# Patient Record
Sex: Female | Born: 1995 | Race: Black or African American | Hispanic: No | Marital: Single | Smoking: Former smoker
Health system: Southern US, Community
[De-identification: ages and names within clinical notes are randomized; demographics above are authoritative.]

## PROBLEM LIST (undated history)

## (undated) DIAGNOSIS — J45909 Unspecified asthma, uncomplicated: Secondary | ICD-10-CM

## (undated) DIAGNOSIS — D649 Anemia, unspecified: Secondary | ICD-10-CM

## (undated) DIAGNOSIS — K219 Gastro-esophageal reflux disease without esophagitis: Secondary | ICD-10-CM

---
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
## 2017-04-21 ENCOUNTER — Emergency Department (HOSPITAL_COMMUNITY)
Admission: EM | Admit: 2017-04-21 | Discharge: 2017-04-21 | Disposition: A | Discharge status: Home / Self Care | Payer: Medicaid Other | Attending: Emergency Medicine | Admitting: Emergency Medicine

## 2017-04-21 DIAGNOSIS — R0602 Shortness of breath: Secondary | ICD-10-CM

## 2017-04-21 DIAGNOSIS — R062 Wheezing: Secondary | ICD-10-CM

## 2017-04-21 DIAGNOSIS — J4541 Moderate persistent asthma with (acute) exacerbation: Secondary | ICD-10-CM | POA: Insufficient documentation

## 2017-04-21 DIAGNOSIS — Z79899 Other long term (current) drug therapy: Secondary | ICD-10-CM | POA: Insufficient documentation

## 2017-04-21 MED ORDER — IPRATROPIUM BROMIDE 0.02 % IN SOLN: 0.5000 mg | Freq: Four times a day (QID) | RESPIRATORY_TRACT | 0 refills | Status: DC | PRN

## 2017-04-21 MED ORDER — PREDNISONE 20 MG PO TABS
60.0000 mg | ORAL_TABLET | Freq: Once | ORAL | Status: AC
  Administered 2017-04-21: 60 mg via ORAL
  Filled 2017-04-21: qty 3

## 2017-04-21 MED ORDER — IPRATROPIUM BROMIDE 0.02 % IN SOLN
0.5000 mg | Freq: Once | RESPIRATORY_TRACT | Status: AC
  Administered 2017-04-21: 0.5 mg via RESPIRATORY_TRACT
  Filled 2017-04-21: qty 2.5

## 2017-04-21 MED ORDER — ALBUTEROL SULFATE (2.5 MG/3ML) 0.083% IN NEBU
5.0000 mg | INHALATION_SOLUTION | Freq: Once | RESPIRATORY_TRACT | Status: AC
  Administered 2017-04-21: 5 mg via RESPIRATORY_TRACT
  Filled 2017-04-21: qty 6

## 2017-04-21 MED ORDER — PREDNISONE 20 MG PO TABS: ORAL_TABLET | ORAL | 0 refills | Status: DC

## 2017-04-21 NOTE — Discharge Instructions (Signed)
Continue to stay well-hydrated. Use Mucinex for cough suppression/expectoration of mucus. Use over the counter antihistamines such as zyrtec or allegra to decrease symptoms and frequency of asthma attacks. Use albuterol nebulizer at home, and use the added atrovent nebulizer medication, both as directed as needed for cough/chest congestion/wheezing/shortness of breath/etc. Take prednisone as directed for your asthma exacerbation, starting tomorrow since you received today's dose in the ER today. Followup with your primary care doctor in 5-7 days for recheck of ongoing symptoms. Return to emergency department for emergent changing or worsening of symptoms.

## 2017-04-21 NOTE — ED Notes (Signed)
Pt ambulatory and independent at discharge.  Verbalized understanding of discharge instructions 

## 2017-04-21 NOTE — ED Triage Notes (Signed)
Pt states that she started having asthma attack this morning and used her neb at home w/o relief. 97% RA. Coughing. Alert and oriented.

## 2017-04-21 NOTE — ED Provider Notes (Signed)
[facility]-EMERGENCY DEPT Provider Note   CSN: [id number] Arrival date & time: 04/21/17  1408     History   Chief Complaint Chief Complaint  Patient presents with  . Asthma    HPI Ashley Benton is a 21 y.o. female with a PMHx of asthma, who presents to the ED with complaints of asthma attack that began around 10:30 AM approximately 4 hours prior to evaluation. Patient states that she was at work and she was sweeping outside, and felt that coming back into the air conditioned building after being outside in the heat and mugginess/weather triggered her to have an asthma attack. Her symptoms feel the same as her prior asthma attacks, and include: Chest tightness, wheezing, shortness of breath, dry cough, and rhinorrhea. She used her albuterol nebulizer with minimal relief. Outside exposure worsened symptoms. She has frequent asthma attacks, her most recent one was 02/19/17 when [facility]; that was the last time she was on steroids, states she almost always gets put on steroids when her asthma flares up. She is a nonsmoker, denies any sick contacts. She denies any fevers, chills, sore throat, ear pain or drainage, leg swelling, recent travel/surgery/immobilization, abd pain, N/V/D/C, hematuria, dysuria, myalgias, arthralgias, numbness, tingling, focal weakness, or any other complaints at this time.    The history is provided by the patient and medical records. No language interpreter was used.  Shortness of Breath  This is a recurrent problem. The average episode lasts 4 hours. The problem occurs continuously.The current episode started 3 to 5 hours ago. The problem has not changed since onset.Associated symptoms include rhinorrhea, cough and wheezing. Pertinent negatives include no fever, no sore throat, no ear pain, no chest pain, no vomiting, no abdominal pain and no leg swelling. The problem's precipitants include weather/humidity. She has tried beta-agonist inhalers for the  symptoms. The treatment provided mild relief. Associated medical issues include asthma.    Past Medical History:  Diagnosis Date  . Asthma     There are no active problems to display for this patient.   No past surgical history on file.  OB History    No data available       Home Medications    Prior to Admission medications   Medication Sig Start Date End Date Taking? Authorizing Provider  ALBUTEROL IN Inhale into the lungs.    [provider]  amoxicillin (AMOXIL) 500 MG capsule Take 1 capsule (500 mg total) by mouth 3 (three) times daily. 01/23/13   Linna HoffKindl, James D, MD  sulfamethoxazole-trimethoprim (SEPTRA DS) 800-160 MG per tablet Take 1 tablet by mouth 2 (two) times daily. 12/23/13   Susy FrizzleSheldon, Charles, MD    Family History No family history on file.  Social History Social History  Substance Use Topics  . Smoking status: Current Every Day Smoker  . Smokeless tobacco: Not on file  . Alcohol use No     Allergies   Patient has no known allergies.   Review of Systems Review of Systems  Constitutional: Negative for chills and fever.  HENT: Positive for rhinorrhea. Negative for ear discharge, ear pain and sore throat.   Respiratory: Positive for cough, chest tightness, shortness of breath and wheezing.   Cardiovascular: Negative for chest pain and leg swelling.  Gastrointestinal: Negative for abdominal pain, constipation, diarrhea, nausea and vomiting.  Genitourinary: Negative for dysuria and hematuria.  Musculoskeletal: Negative for arthralgias and myalgias.  Skin: Negative for color change.  Allergic/Immunologic: Negative for immunocompromised state.  Neurological: Negative for weakness and numbness.  Psychiatric/Behavioral: Negative for confusion.   All other systems reviewed and are negative for acute change except as noted in the HPI.    Physical Exam Updated Vital Signs BP 108/78 (BP Location: Right Arm)   Pulse 87   Temp 98.3 F (36.8 C)  (Oral)   Resp 20   LMP 04/02/2017 (Exact Date)   SpO2 97%   Physical Exam  Constitutional: She is oriented to person, place, and time. Vital signs are normal. She appears well-developed and well-nourished.  Non-toxic appearance. No distress.  Afebrile, nontoxic, NAD  HENT:  Head: Normocephalic and atraumatic.  Mouth/Throat: Oropharynx is clear and moist and mucous membranes are normal.  Eyes: Conjunctivae and EOM are normal. Right eye exhibits no discharge. Left eye exhibits no discharge.  Neck: Normal range of motion. Neck supple.  Cardiovascular: Normal rate, regular rhythm, normal heart sounds and intact distal pulses.  Exam reveals no gallop and no friction rub.   No murmur heard. Pulmonary/Chest: Effort normal. No respiratory distress. She has decreased breath sounds. She has wheezes. She has no rhonchi. She has no rales.  Airways sound tight, with slightly globally diminished lung sounds throughout, faint expiratory wheezing barely audible, no rhonchi/rales, no hypoxia or increased WOB, speaking in full sentences, SpO2 97% on RA   Abdominal: Soft. Normal appearance and bowel sounds are normal. She exhibits no distension. There is no tenderness. There is no rigidity, no rebound, no guarding, no CVA tenderness, no tenderness at McBurney's point and negative Murphy's sign.  Musculoskeletal: Normal range of motion.  MAE x4 Strength and sensation grossly intact in all extremities Distal pulses intact No pedal edema, neg homan's bilaterally   Neurological: She is alert and oriented to person, place, and time. She has normal strength. No sensory deficit.  Skin: Skin is warm, dry and intact. No rash noted.  Psychiatric: She has a normal mood and affect.  Nursing note and vitals reviewed.    ED Treatments / Results  Labs (all labs ordered are listed, but only abnormal results are displayed) Labs Reviewed - No data to display  EKG  EKG Interpretation None       Radiology No  results found.  Procedures Procedures (including critical care time)  Medications Ordered in ED Medications  albuterol (PROVENTIL) (2.5 MG/3ML) 0.083% nebulizer solution 5 mg (5 mg Nebulization Given 04/21/17 1433)  albuterol (PROVENTIL) (2.5 MG/3ML) 0.083% nebulizer solution 5 mg (5 mg Nebulization Given 04/21/17 1502)  ipratropium (ATROVENT) nebulizer solution 0.5 mg (0.5 mg Nebulization Given 04/21/17 1502)  predniSONE (DELTASONE) tablet 60 mg (60 mg Oral Given 04/21/17 1502)     Initial Impression / Assessment and Plan / ED Course  I have reviewed the triage vital signs and the nursing notes.  Pertinent labs & imaging results that were available during my care of the patient were reviewed by me and considered in my medical decision making (see chart for details).     21 y.o. female here with asthma attack that occurred when she went outside to sweep at work around 10:30am. Used home albuterol neb without much improvement. On exam, tight sounding airways with mildly diminished breath sounds throughout, faint expiratory wheezing, no rhonchi/rales, no hypoxia or tachycardia, no LE swelling. 1st albuterol neb given here is almost done; will get second one with added atrovent as well, and give prednisone. Will reassess after that; doubt need for labs/imaging at this point, but will see how she does after next neb.  4:52 PM Pt feeling much better and lung sounds greatly improved after duoneb. Doubt need for further emergent work up or treatment at this time. Will d/c home with atrovent nebs to use with her albuterol at home, and prednisone taper. Advised OTC remedies to help with asthma control, and use of home asthma meds as well. F/up with PCP in 5-7 days for recheck of symptoms. I explained the diagnosis and have given explicit precautions to return to the ER including for any other new or worsening symptoms. The patient understands and accepts the medical plan as it's been dictated and I have  answered their questions. Discharge instructions concerning home care and prescriptions have been given. The patient is STABLE and is discharged to home in good condition.    Final Clinical Impressions(s) / ED Diagnoses   Final diagnoses:  Moderate persistent asthma with exacerbation  Shortness of breath  Wheezing    New Prescriptions New Prescriptions   IPRATROPIUM (ATROVENT) 0.02 % NEBULIZER SOLUTION    Take 2.5 mLs (0.5 mg total) by nebulization every 6 (six) hours as needed for wheezing or shortness of breath. MAX 4 DOSES PER DAY   PREDNISONE (DELTASONE) 20 MG TABLET    3 tabs po daily x 4 days STARTING 04/22/17     Kalkidan Caudell, [place], [place] 04/21/17 1653    Derwood Kaplan, MD 04/22/17 [phone]

## 2018-08-08 ENCOUNTER — Encounter (HOSPITAL_COMMUNITY): Payer: Self-pay | Admitting: Emergency Medicine

## 2018-08-08 ENCOUNTER — Emergency Department (HOSPITAL_COMMUNITY): Payer: Self-pay

## 2018-08-08 ENCOUNTER — Emergency Department (HOSPITAL_COMMUNITY)
Admission: EM | Admit: 2018-08-08 | Discharge: 2018-08-08 | Disposition: A | Discharge status: Home / Self Care | Payer: Self-pay | Attending: Emergency Medicine | Admitting: Emergency Medicine

## 2018-08-08 DIAGNOSIS — Z87891 Personal history of nicotine dependence: Secondary | ICD-10-CM | POA: Insufficient documentation

## 2018-08-08 DIAGNOSIS — J45901 Unspecified asthma with (acute) exacerbation: Secondary | ICD-10-CM | POA: Insufficient documentation

## 2018-08-08 HISTORY — DX: Gastro-esophageal reflux disease without esophagitis: K21.9

## 2018-08-08 HISTORY — DX: Anemia, unspecified: D64.9

## 2018-08-08 MED ORDER — TINIDAZOLE 500 MG PO TABS: 1000.0000 mg | ORAL_TABLET | Freq: Every day | ORAL | 0 refills | Status: AC

## 2018-08-08 MED ORDER — FLUCONAZOLE 150 MG PO TABS: 150.0000 mg | ORAL_TABLET | Freq: Every day | ORAL | 0 refills | Status: AC

## 2018-08-08 MED ORDER — PREDNISONE 20 MG PO TABS
40.0000 mg | ORAL_TABLET | Freq: Once | ORAL | Status: AC
  Administered 2018-08-08: 40 mg via ORAL
  Filled 2018-08-08: qty 2

## 2018-08-08 MED ORDER — IPRATROPIUM-ALBUTEROL 0.5-2.5 (3) MG/3ML IN SOLN
3.0000 mL | Freq: Once | RESPIRATORY_TRACT | Status: AC
  Administered 2018-08-08: 3 mL via RESPIRATORY_TRACT
  Filled 2018-08-08: qty 3

## 2018-08-08 MED ORDER — ALBUTEROL SULFATE (2.5 MG/3ML) 0.083% IN NEBU: 5.0000 mg | INHALATION_SOLUTION | Freq: Once | RESPIRATORY_TRACT | Status: DC

## 2018-08-08 MED ORDER — PREDNISONE 10 MG PO TABS: 40.0000 mg | ORAL_TABLET | Freq: Every day | ORAL | 0 refills | Status: AC

## 2018-08-08 NOTE — Discharge Instructions (Signed)
Please return for any problem.  Follow-up with your regular care provider as instructed. °

## 2018-08-08 NOTE — ED Triage Notes (Signed)
Pt reports since waking up yesterday felt her asthma causing her to have trouble breathing. Used inhaler about 10 times yesterday and then used nebulizer last night.  Reports that [facility] where about week ago had to get steroids.

## 2018-08-08 NOTE — ED Provider Notes (Signed)
[facility]-EMERGENCY DEPT Provider Note   CSN: [id number] Arrival date & time: 08/08/18  4098     History   Chief Complaint Chief Complaint  Patient presents with  . Asthma    HPI Ashley Benton is a 22 y.o. female.  22 year old female with prior history as detailed below presents for evaluation of asthma exacerbation. Patient reports increased wheezing and cough over the last 24 hours. No associated fever. She has used her MDI at home multiple times without improvement. She is not currently on antibiotics or steroids. She denies associated chest pain, nausea, vomiting, back pain, abdominal pain, or other acute complaint.   She denies pregnancy and declines Pregnancy test today.   The history is provided by the patient and medical records.  Asthma  This is a recurrent problem. The current episode started yesterday. The problem occurs every several days. The problem has not changed since onset.Associated symptoms include shortness of breath. Pertinent negatives include no chest pain, no abdominal pain and no headaches. Nothing aggravates the symptoms. Nothing relieves the symptoms. The treatment provided mild relief.    Past Medical History:  Diagnosis Date  . Acid reflux   . Anemia   . Asthma     There are no active problems to display for this patient.   History reviewed. No pertinent surgical history.   OB History   None      Home Medications    Prior to Admission medications   Medication Sig Start Date End Date Taking? Authorizing Provider  ALBUTEROL IN Inhale into the lungs.    [provider]  amoxicillin (AMOXIL) 500 MG capsule Take 1 capsule (500 mg total) by mouth 3 (three) times daily. 01/23/13   Linna Hoff, MD  ipratropium (ATROVENT) 0.02 % nebulizer solution Take 2.5 mLs (0.5 mg total) by nebulization every 6 (six) hours as needed for wheezing or shortness of breath. MAX 4 DOSES PER DAY 04/21/17   Street, [place], [place]    predniSONE (DELTASONE) 20 MG tablet 3 tabs po daily x 4 days STARTING 04/22/17 04/21/17   Street, [place], PA-C  sulfamethoxazole-trimethoprim (SEPTRA DS) 800-160 MG per tablet Take 1 tablet by mouth 2 (two) times daily. 12/23/13   Susy Frizzle, MD    Family History No family history on file.  Social History Social History   Tobacco Use  . Smoking status: Former Games developer  . Smokeless tobacco: Never Used  Substance Use Topics  . Alcohol use: Yes  . Drug use: Not on file     Allergies   Patient has no known allergies.   Review of Systems Review of Systems  Respiratory: Positive for shortness of breath.   Cardiovascular: Negative for chest pain.  Gastrointestinal: Negative for abdominal pain.  Neurological: Negative for headaches.  All other systems reviewed and are negative.    Physical Exam Updated Vital Signs BP 104/77 (BP Location: Right Arm)   Pulse 86   Temp 98.8 F (37.1 C) (Oral)   Resp 20   LMP 08/02/2018   SpO2 99%   Physical Exam  Constitutional: She is oriented to person, place, and time. She appears well-developed and well-nourished. No distress.  HENT:  Head: Normocephalic and atraumatic.  Mouth/Throat: Oropharynx is clear and moist.  Eyes: Pupils are equal, round, and reactive to light. Conjunctivae and EOM are normal.  Neck: Normal range of motion. Neck supple.  Cardiovascular: Normal rate, regular rhythm and normal heart sounds.  Pulmonary/Chest: Effort normal. No respiratory distress.  She has wheezes.  Trace expiratory wheezes in all lung fields   Abdominal: Soft. She exhibits no distension. There is no tenderness.  Musculoskeletal: Normal range of motion. She exhibits no edema or deformity.  Neurological: She is alert and oriented to person, place, and time.  Skin: Skin is warm and dry.  Psychiatric: She has a normal mood and affect.  Nursing note and vitals reviewed.    ED Treatments / Results  Labs (all labs ordered are listed, but only  abnormal results are displayed) Labs Reviewed - No data to display  EKG None  Radiology Dg Chest 2 View  Result Date: 08/08/2018 CLINICAL DATA:  asthma causing her to have trouble breathing. Used inhaler about 10 times yesterday and then used nebulizer last night. Reports that [facility] where about week ago had to get steroidsAsthma, former smoker EXAM: CHEST - 2 VIEW COMPARISON:  12/20/2014 FINDINGS: Lungs are clear. Heart size and mediastinal contours are within normal limits. No effusion. Visualized bones unremarkable. IMPRESSION: No acute cardiopulmonary disease. Electronically Signed   By: Corlis Leak  Hassell M.D.   On: 08/08/2018 09:13    Procedures Procedures (including critical care time)  Medications Ordered in ED Medications  ipratropium-albuterol (DUONEB) 0.5-2.5 (3) MG/3ML nebulizer solution 3 mL (has no administration in time range)  predniSONE (DELTASONE) tablet 40 mg (has no administration in time range)     Initial Impression / Assessment and Plan / ED Course  I have reviewed the triage vital signs and the nursing notes.  Pertinent labs & imaging results that were available during my care of the patient were reviewed by me and considered in my medical decision making (see chart for details).     MDM  Screen complete  Patient is presenting for evaluation of asthma exacerbation.  No evidence of other acute pathology found on workup. Patient feels improved following her evaluation and desires discharge.   She does request treatment for BV (she was treated for same earlier this month and now has recurrent symptoms). She declines repeat pelvic exam or workup for vaginal discharge today.   Strict return precautions given and understood.  Importance of close follow-up was stressed.  Final Clinical Impressions(s) / ED Diagnoses   Final diagnoses:  Mild asthma with exacerbation, unspecified whether persistent    ED Discharge Orders           Ordered    predniSONE (DELTASONE) 10 MG tablet  Daily     08/08/18 1018    fluconazole (DIFLUCAN) 150 MG tablet  Daily     08/08/18 1018    tinidazole (TINDAMAX) 500 MG tablet  Daily with breakfast     08/08/18 1018           Wynetta FinesMessick, Amaka Gluth C, MD 08/08/18 1030

## 2018-09-30 ENCOUNTER — Emergency Department (HOSPITAL_COMMUNITY): Payer: Self-pay

## 2018-09-30 ENCOUNTER — Encounter (HOSPITAL_COMMUNITY): Payer: Self-pay

## 2018-09-30 ENCOUNTER — Emergency Department (HOSPITAL_COMMUNITY)
Admission: EM | Admit: 2018-09-30 | Discharge: 2018-09-30 | Disposition: A | Discharge status: Home / Self Care | Payer: Self-pay | Attending: Emergency Medicine | Admitting: Emergency Medicine

## 2018-09-30 DIAGNOSIS — Z87891 Personal history of nicotine dependence: Secondary | ICD-10-CM | POA: Insufficient documentation

## 2018-09-30 DIAGNOSIS — R52 Pain, unspecified: Secondary | ICD-10-CM

## 2018-09-30 DIAGNOSIS — N3090 Cystitis, unspecified without hematuria: Secondary | ICD-10-CM

## 2018-09-30 DIAGNOSIS — N3001 Acute cystitis with hematuria: Secondary | ICD-10-CM | POA: Insufficient documentation

## 2018-09-30 DIAGNOSIS — R102 Pelvic and perineal pain: Secondary | ICD-10-CM | POA: Insufficient documentation

## 2018-09-30 DIAGNOSIS — Z202 Contact with and (suspected) exposure to infections with a predominantly sexual mode of transmission: Secondary | ICD-10-CM

## 2018-09-30 DIAGNOSIS — Z79899 Other long term (current) drug therapy: Secondary | ICD-10-CM | POA: Insufficient documentation

## 2018-09-30 DIAGNOSIS — J45909 Unspecified asthma, uncomplicated: Secondary | ICD-10-CM | POA: Insufficient documentation

## 2018-09-30 LAB — WET PREP, GENITAL
Clue Cells Wet Prep HPF POC: NONE SEEN
SPERM: NONE SEEN
Trich, Wet Prep: NONE SEEN
WBC, Wet Prep HPF POC: NONE SEEN
Yeast Wet Prep HPF POC: NONE SEEN

## 2018-09-30 LAB — I-STAT BETA HCG BLOOD, ED (MC, WL, AP ONLY): I-stat hCG, quantitative: <5 m[IU]/mL (ref <5)

## 2018-09-30 LAB — RAPID HIV SCREEN (HIV 1/2 AB+AG)
HIV 1/2 ANTIBODIES: NONREACTIVE
HIV-1 P24 ANTIGEN - HIV24: NONREACTIVE

## 2018-09-30 LAB — URINALYSIS, ROUTINE W REFLEX MICROSCOPIC
BILIRUBIN URINE: NEGATIVE
GLUCOSE, UA: NEGATIVE mg/dL
Ketones, ur: 20 mg/dL — AB
Nitrite: POSITIVE — AB
PROTEIN: 30 mg/dL — AB
RBC / HPF: >50 RBC/hpf — ABNORMAL HIGH (ref 0–5)
Specific Gravity, Urine: 1.023 (ref 1.005–1.030)
WBC, UA: >50 WBC/hpf — ABNORMAL HIGH (ref 0–5)
pH: 6 (ref 5.0–8.0)

## 2018-09-30 MED ORDER — DOXYCYCLINE HYCLATE 100 MG PO CAPS: 100.0000 mg | ORAL_CAPSULE | Freq: Two times a day (BID) | ORAL | 0 refills | Status: AC

## 2018-09-30 MED ORDER — NITROFURANTOIN MONOHYD MACRO 100 MG PO CAPS: 100.0000 mg | ORAL_CAPSULE | Freq: Two times a day (BID) | ORAL | 0 refills | Status: AC

## 2018-09-30 MED ORDER — GENTAMICIN SULFATE 40 MG/ML IJ SOLN
240.0000 mg | Freq: Once | INTRAMUSCULAR | Status: AC
  Administered 2018-09-30: 240 mg via INTRAMUSCULAR
  Filled 2018-09-30 (×2): qty 6

## 2018-09-30 MED ORDER — AZITHROMYCIN 250 MG PO TABS
2000.0000 mg | ORAL_TABLET | Freq: Once | ORAL | Status: AC
  Administered 2018-09-30: 2000 mg via ORAL
  Filled 2018-09-30: qty 8

## 2018-09-30 NOTE — ED Notes (Signed)
US at bedside

## 2018-09-30 NOTE — ED Triage Notes (Signed)
Pt presents with c/o hematuria. Pt reports that she has been struggling with bladder issues, i.e. a UTI. Pt reports that she is on her period but she does believe that she also has blood in her urine.

## 2018-09-30 NOTE — ED Provider Notes (Signed)
[facility]   CSN: [id number] Arrival date & time: 09/30/18  1343     History   Chief Complaint Chief Complaint  Patient presents with  . Hematuria    HPI Ashley Benton is a 23 y.o. female.  HPI   23 yo F here with recurrent dysuria, pelvic pain. Pt states that over the past 2-3 months, she has had recurrent UTIs despite multiple outpatient courses of ABX. Most recently, she felt fine then developed lower abd pain, dysuria, frequency x 3-4 days. No fever, flank pain, or nausea. Of note, pt's partner just told her he has had chlamydia for this whole time period and did not tell her. She notes increased pain and bleeding w/ sex. Denies any RUQ pain. No h/o STDs, and has only been sexually active w/ one partner. No other complaints.   Past Medical History:  Diagnosis Date  . Acid reflux   . Anemia   . Asthma     There are no active problems to display for this patient.   History reviewed. No pertinent surgical history.   OB History   No obstetric history on file.      Home Medications    Prior to Admission medications   Medication Sig Start Date End Date Taking? Authorizing Provider  albuterol (PROVENTIL HFA;VENTOLIN HFA) 108 (90 Base) MCG/ACT inhaler Inhale 2 puffs into the lungs every 4 (four) hours as needed for wheezing or shortness of breath.   Yes [provider]  medroxyPROGESTERone (DEPO-PROVERA) 150 MG/ML injection Inject 150 mg into the muscle every 3 (three) months.   Yes [provider]  doxycycline (VIBRAMYCIN) 100 MG capsule Take 1 capsule (100 mg total) by mouth 2 (two) times daily for 14 days. 09/30/18 10/14/18  Shaune Pollack, MD  nitrofurantoin, macrocrystal-monohydrate, (MACROBID) 100 MG capsule Take 1 capsule (100 mg total) by mouth 2 (two) times daily for 7 days. 09/30/18 10/07/18  Shaune Pollack, MD    Family History History reviewed. No pertinent family history.  Social  History Social History   Tobacco Use  . Smoking status: Former Games developer  . Smokeless tobacco: Never Used  Substance Use Topics  . Alcohol use: Yes  . Drug use: Not on file     Allergies   Cephalexin; Molds & smuts; Other; Lactose; and Metronidazole   Review of Systems Review of Systems  Constitutional: Negative for chills, fatigue and fever.  HENT: Negative for congestion and rhinorrhea.   Eyes: Negative for visual disturbance.  Respiratory: Negative for cough, shortness of breath and wheezing.   Cardiovascular: Negative for chest pain and leg swelling.  Gastrointestinal: Negative for abdominal pain, diarrhea, nausea and vomiting.  Genitourinary: Positive for dysuria, frequency, vaginal bleeding and vaginal pain. Negative for flank pain.  Musculoskeletal: Negative for neck pain and neck stiffness.  Skin: Negative for rash and wound.  Allergic/Immunologic: Negative for immunocompromised state.  Neurological: Negative for syncope, weakness and headaches.  All other systems reviewed and are negative.    Physical Exam Updated Vital Signs BP 113/63   Pulse 91   Temp 98.1 F (36.7 C) (Oral)   Resp 16   LMP 09/30/2018   SpO2 100%   Physical Exam Vitals signs and nursing note reviewed.  Constitutional:      General: She is not in acute distress.    Appearance: She is well-developed.  HENT:     Head: Normocephalic and atraumatic.  Eyes:     Conjunctiva/sclera: Conjunctivae normal.  Neck:  Musculoskeletal: Neck supple.  Cardiovascular:     Rate and Rhythm: Normal rate and regular rhythm.     Heart sounds: Normal heart sounds. No murmur. No friction rub.  Pulmonary:     Effort: Pulmonary effort is normal. No respiratory distress.     Breath sounds: Normal breath sounds. No wheezing or rales.  Abdominal:     General: Abdomen is flat. There is no distension.     Palpations: Abdomen is soft.     Tenderness: There is abdominal tenderness (mild, suprapubic, no CVAT).  There is no right CVA tenderness or left CVA tenderness.  Genitourinary:    Comments: Marked CMT and mucosal friability. No adnexal fullness. Skin:    General: Skin is warm.     Capillary Refill: Capillary refill takes less than 2 seconds.  Neurological:     Mental Status: She is alert and oriented to person, place, and time.     Motor: No abnormal muscle tone.      ED Treatments / Results  Labs (all labs ordered are listed, but only abnormal results are displayed) Labs Reviewed  URINALYSIS, ROUTINE W REFLEX MICROSCOPIC - Abnormal; Notable for the following components:      Result Value   APPearance CLOUDY (*)    Hgb urine dipstick LARGE (*)    Ketones, ur 20 (*)    Protein, ur 30 (*)    Nitrite POSITIVE (*)    Leukocytes, UA LARGE (*)    RBC / HPF >50 (*)    WBC, UA >50 (*)    Bacteria, UA MANY (*)    Non Squamous Epithelial 0-5 (*)    All other components within normal limits  WET PREP, GENITAL  URINE CULTURE  RAPID HIV SCREEN (HIV 1/2 AB+AG)  RPR  I-STAT BETA HCG BLOOD, ED ([facility], [facility], [facility] ONLY)  GC/CHLAMYDIA PROBE AMP ([place]) NOT AT [facility]    EKG None  Radiology [place] Pelvis Complete  Result Date: 09/30/2018 CLINICAL DATA:  Pelvic pain. Started Depo-Provera in the beginning of November. EXAM: TRANSABDOMINAL AND TRANSVAGINAL ULTRASOUND OF PELVIS DOPPLER ULTRASOUND OF OVARIES TECHNIQUE: Both transabdominal and transvaginal ultrasound examinations of the pelvis were performed. Transabdominal technique was performed for global imaging of the pelvis including uterus, ovaries, adnexal regions, and pelvic cul-de-sac. It was necessary to proceed with endovaginal exam following the transabdominal exam to visualize the uterus and ovaries. Color and duplex Doppler ultrasound was utilized to evaluate blood flow to the ovaries. COMPARISON:  None. FINDINGS: Uterus Measurements: 6.4 x 3.7 x 5.1 cm. Uterus is anteverted. Homogeneous myometrium. No myometrial mass lesions. Endometrium  Thickness: 4 mm.  No focal abnormality visualized. Right ovary Measurements: 3.8 x 1.6 x 2.3 cm = volume: 7.2 mL. Normal appearance/no adnexal mass. Left ovary Measurements: 3.2 x 1.6 x 2.8 cm = volume: 7.4 mL. Normal appearance/no adnexal mass. Pulsed Doppler evaluation of both ovaries demonstrates normal low-resistance arterial and venous waveforms. Other findings Minimal free fluid in the low pelvis. IMPRESSION: Normal ultrasound appearance of the uterus and ovaries. No evidence of ovarian mass or torsion. Electronically Signed   By: Burman NievesWilliam  Stevens M.D.   On: 09/30/2018 21:29   [place] Art/ven Flow Abd Pelv Doppler  Result Date: 09/30/2018 CLINICAL DATA:  Pelvic pain. Started Depo-Provera in the beginning of November. EXAM: TRANSABDOMINAL AND TRANSVAGINAL ULTRASOUND OF PELVIS DOPPLER ULTRASOUND OF OVARIES TECHNIQUE: Both transabdominal and transvaginal ultrasound examinations of the pelvis were performed. Transabdominal technique was performed for global imaging of the pelvis including uterus, ovaries,  adnexal regions, and pelvic cul-de-sac. It was necessary to proceed with endovaginal exam following the transabdominal exam to visualize the uterus and ovaries. Color and duplex Doppler ultrasound was utilized to evaluate blood flow to the ovaries. COMPARISON:  None. FINDINGS: Uterus Measurements: 6.4 x 3.7 x 5.1 cm. Uterus is anteverted. Homogeneous myometrium. No myometrial mass lesions. Endometrium Thickness: 4 mm.  No focal abnormality visualized. Right ovary Measurements: 3.8 x 1.6 x 2.3 cm = volume: 7.2 mL. Normal appearance/no adnexal mass. Left ovary Measurements: 3.2 x 1.6 x 2.8 cm = volume: 7.4 mL. Normal appearance/no adnexal mass. Pulsed Doppler evaluation of both ovaries demonstrates normal low-resistance arterial and venous waveforms. Other findings Minimal free fluid in the low pelvis. IMPRESSION: Normal ultrasound appearance of the uterus and ovaries. No evidence of ovarian mass or torsion.  Electronically Signed   By: Burman Nieves M.D.   On: 09/30/2018 21:29   US Pelvic Complete W Transvaginal And Torsion R/o  Result Date: 09/30/2018 CLINICAL DATA:  Pelvic pain. Started Depo-Provera in the beginning of November. EXAM: TRANSABDOMINAL AND TRANSVAGINAL ULTRASOUND OF PELVIS DOPPLER ULTRASOUND OF OVARIES TECHNIQUE: Both transabdominal and transvaginal ultrasound examinations of the pelvis were performed. Transabdominal technique was performed for global imaging of the pelvis including uterus, ovaries, adnexal regions, and pelvic cul-de-sac. It was necessary to proceed with endovaginal exam following the transabdominal exam to visualize the uterus and ovaries. Color and duplex Doppler ultrasound was utilized to evaluate blood flow to the ovaries. COMPARISON:  None. FINDINGS: Uterus Measurements: 6.4 x 3.7 x 5.1 cm. Uterus is anteverted. Homogeneous myometrium. No myometrial mass lesions. Endometrium Thickness: 4 mm.  No focal abnormality visualized. Right ovary Measurements: 3.8 x 1.6 x 2.3 cm = volume: 7.2 mL. Normal appearance/no adnexal mass. Left ovary Measurements: 3.2 x 1.6 x 2.8 cm = volume: 7.4 mL. Normal appearance/no adnexal mass. Pulsed Doppler evaluation of both ovaries demonstrates normal low-resistance arterial and venous waveforms. Other findings Minimal free fluid in the low pelvis. IMPRESSION: Normal ultrasound appearance of the uterus and ovaries. No evidence of ovarian mass or torsion. Electronically Signed   By: Burman Nieves M.D.   On: 09/30/2018 21:29    Procedures Procedures (including critical care time)  Medications Ordered in ED Medications  gentamicin (GARAMYCIN) injection 240 mg (240 mg Intramuscular Given 09/30/18 2213)  azithromycin (ZITHROMAX) tablet 2,000 mg (2,000 mg Oral Given 09/30/18 2212)     Initial Impression / Assessment and Plan / ED Course  I have reviewed the triage vital signs and the nursing notes.  Pertinent labs & imaging results that  were available during my care of the patient were reviewed by me and considered in my medical decision making (see chart for details).     23 yo F here with recurrent UTI, also pelvic pain and known chlamydia exposure. While UA is consistent with UTI, her recurrent sx raise concern for PID, and she has known +chlamydia exposure. Pelvic U/S shows no signs of TOA or other complication from likely undiagnosed PID x months. No fever, RUQ pain, ro signs of sepsis. No flank pain or signs of pyelo. Will tx empirically, d/c with outpt follow-up. HIV, RPR sent.  Final Clinical Impressions(s) / ED Diagnoses   Final diagnoses:  STD exposure  Cystitis    ED Discharge Orders         Ordered    doxycycline (VIBRAMYCIN) 100 MG capsule  2 times daily     09/30/18 2210    nitrofurantoin, macrocrystal-monohydrate, (MACROBID) 100 MG capsule  2 times daily     09/30/18 2210           Shaune PollackIsaacs, Mattilyn Crites, MD 09/30/18 2226

## 2018-09-30 NOTE — Discharge Instructions (Addendum)
IT IS VERY IMPORTANT TO FINISH THE ENTIRE COURSE OF YOUR ANTIBIOTICS TO TREAT YOUR PELVIC INFECTION. FAILURE TO DO SO CAN RESULT IN ONGOING INFECTION WITH SCARRING, PAIN, AND INFECTION THAT COULD LEAD TO INFERTILITY OR MAJOR ISSUES IN THE FUTURE.  You have been seen today in the Emergency Department (ED) for pelvic pain and discharge.  Your workup today shows that you had one or more sexually transmitted infections (STIs).  You have been treated with a one time dose medication for both of these conditions.  Please have your partner tested for STD's and do not resume sexual activity until you and your partner have confirmed negative test results or have both been treated.  Please follow up with your doctor as soon as possible regarding todays ED visit and your symptoms.   Return to the ED if your pain worsens, you develop a fever, or for any other symptoms that concern you.

## 2018-09-30 NOTE — ED Notes (Signed)
Pelvic supplies at bedside. 

## 2018-10-01 LAB — GC/CHLAMYDIA PROBE AMP (~~LOC~~) NOT AT ARMC
Chlamydia: POSITIVE — AB
Neisseria Gonorrhea: NEGATIVE

## 2018-10-01 LAB — RPR: RPR: NONREACTIVE

## 2018-10-03 LAB — URINE CULTURE

## 2018-10-04 ENCOUNTER — Telehealth: Payer: Self-pay | Admitting: Emergency Medicine

## 2018-10-04 NOTE — Telephone Encounter (Signed)
Post ED Visit - Positive Culture Follow-up: Unsuccessful Patient Follow-up  Culture assessed and recommendations reviewed by:  []  Enzo Bi, Pharm.D. []  Celedonio Miyamoto, Pharm.D., BCPS AQ-ID []  Garvin Fila, Pharm.D., BCPS []  Georgina Pillion, Pharm.D., BCPS []  [place], [address].D., BCPS, AAHIVP []  Estella Husk, Pharm.D., BCPS, AAHIVP [x]  Verlan Friends, PharmD []  Pollyann Samples, PharmD, BCPS  Positive urine culture  []  Patient discharged without antimicrobial prescription and treatment is now indicated [x]  Organism is resistant to prescribed ED discharge antimicrobial []  Patient with positive blood cultures   Unable to contact patient by phone number on file, letter will be sent to address on file  Norm Parcel RN 10/04/2018, 1:26 PM

## 2018-10-04 NOTE — Progress Notes (Signed)
ED Antimicrobial Stewardship Positive Culture Follow Up   Ashley Benton is an 23 y.o. female who presented to [facility] on 09/30/2018 with a chief complaint of  Chief Complaint  Patient presents with  . Hematuria   Recent Results (from the past 720 hour(s))  Urine culture     Status: Abnormal   Collection Time: 09/30/18  4:42 PM  Result Value Ref Range Status   Specimen Description URINE, CLEAN CATCH  Final   Special Requests   Final    NONE Performed at [facility], [address], [place], [place] [address]    Culture >=100,000 COLONIES/mL ESCHERICHIA COLI (A)  Final   Report Status 10/03/2018 FINAL  Final   Organism ID, Bacteria ESCHERICHIA COLI (A)  Final      Susceptibility   Escherichia coli - MIC*    AMPICILLIN 8 SENSITIVE Sensitive     CEFAZOLIN <=4 SENSITIVE Sensitive     CEFTRIAXONE <=1 SENSITIVE Sensitive     CIPROFLOXACIN <=0.25 SENSITIVE Sensitive     GENTAMICIN <=1 SENSITIVE Sensitive     IMIPENEM <=0.25 SENSITIVE Sensitive     NITROFURANTOIN 64 INTERMEDIATE Intermediate     TRIMETH/SULFA <=20 SENSITIVE Sensitive     AMPICILLIN/SULBACTAM <=2 SENSITIVE Sensitive     PIP/TAZO <=4 SENSITIVE Sensitive     Extended ESBL NEGATIVE Sensitive     * >=100,000 COLONIES/mL ESCHERICHIA COLI  Wet prep, genital     Status: None   Collection Time: 09/30/18  7:17 PM  Result Value Ref Range Status   Yeast Wet Prep HPF POC NONE SEEN NONE SEEN Final    Comment: Specimen diluted due to transport tube containing more than 1 ml of saline, interpret results with caution.   Trich, Wet Prep NONE SEEN NONE SEEN Final    Comment: Specimen diluted due to transport tube containing more than 1 ml of saline, interpret results with caution.   Clue Cells Wet Prep HPF POC NONE SEEN NONE SEEN Final    Comment: Specimen diluted due to transport tube containing more than 1 ml of saline, interpret results with caution.   WBC, Wet Prep HPF POC NONE SEEN NONE SEEN Final   Comment: Specimen diluted due to transport tube containing more than 1 ml of saline, interpret results with caution.   Sperm NONE SEEN  Final    Comment: Specimen diluted due to transport tube containing more than 1 ml of saline, interpret results with caution. Performed at [facility], [address], [place], [place] [address]    [x]  Treated with nitrofurantoin, organism resistant to prescribed antimicrobial  New antibiotic prescription: Bactrim DS 1 tablet PO BID x3 days  ED Provider: Ebbie Ridge, PA-C  Roderic Scarce. Zigmund Daniel, PharmD, BCPS PGY2 Infectious Diseases Pharmacy Resident Phone: [phone] 10/04/2018, 9:46 AM

## 2018-10-11 ENCOUNTER — Telehealth: Payer: Self-pay | Admitting: Emergency Medicine

## 2018-10-11 NOTE — Telephone Encounter (Signed)
Post ED Visit - Positive Culture Follow-up: Successful Patient Follow-Up  Culture assessed and recommendations reviewed by:  []  Enzo Bi, Pharm.D. []  Celedonio Miyamoto, Pharm.D., BCPS AQ-ID []  Garvin Fila, Pharm.D., BCPS []  Georgina Pillion, Pharm.D., BCPS []  [place], [address].D., BCPS, AAHIVP []  Estella Husk, Pharm.D., BCPS, AAHIVP []  Lysle Pearl, PharmD, BCPS []  Phillips Climes, PharmD, BCPS []  Agapito Games, PharmD, BCPS [x]  Verlan Friends, PharmD  Positive urine culture  [x]  Patient discharged without antimicrobial prescription and treatment is now indicated []  Organism is resistant to prescribed ED discharge antimicrobial []  Patient with positive blood cultures  Changes discussed with ED provider: Ebbie Ridge PA  New antibiotic prescription: Bactrim DS one tablet PO x three days Called to [facility] [phone]  Contacted patient, date 10/11/2018, time 1600   Ashley Benton 10/11/2018, 5:04 PM

## 2019-06-25 ENCOUNTER — Other Ambulatory Visit: Payer: Self-pay

## 2019-06-25 DIAGNOSIS — Z20822 Contact with and (suspected) exposure to covid-19: Secondary | ICD-10-CM

## 2019-06-27 LAB — NOVEL CORONAVIRUS, NAA: SARS-CoV-2, NAA: NOT DETECTED

## 2020-01-29 ENCOUNTER — Other Ambulatory Visit: Payer: Self-pay

## 2020-01-29 ENCOUNTER — Encounter (HOSPITAL_COMMUNITY): Payer: Self-pay | Admitting: Emergency Medicine

## 2020-01-29 ENCOUNTER — Emergency Department (HOSPITAL_COMMUNITY)
Admission: EM | Admit: 2020-01-29 | Discharge: 2020-01-30 | Disposition: A | Discharge status: Home / Self Care | Payer: BLUE CROSS/BLUE SHIELD | Attending: Emergency Medicine | Admitting: Emergency Medicine

## 2020-01-29 ENCOUNTER — Emergency Department (HOSPITAL_COMMUNITY): Payer: BLUE CROSS/BLUE SHIELD

## 2020-01-29 DIAGNOSIS — Z87891 Personal history of nicotine dependence: Secondary | ICD-10-CM | POA: Diagnosis not present

## 2020-01-29 DIAGNOSIS — J45901 Unspecified asthma with (acute) exacerbation: Secondary | ICD-10-CM | POA: Insufficient documentation

## 2020-01-29 DIAGNOSIS — E876 Hypokalemia: Secondary | ICD-10-CM | POA: Insufficient documentation

## 2020-01-29 DIAGNOSIS — Z20822 Contact with and (suspected) exposure to covid-19: Secondary | ICD-10-CM | POA: Diagnosis not present

## 2020-01-29 DIAGNOSIS — Z79899 Other long term (current) drug therapy: Secondary | ICD-10-CM | POA: Diagnosis not present

## 2020-01-29 DIAGNOSIS — R0602 Shortness of breath: Secondary | ICD-10-CM | POA: Diagnosis present

## 2020-01-29 LAB — I-STAT CHEM 8, ED
BUN: 4 mg/dL — ABNORMAL LOW (ref 6–20)
Calcium, Ion: 1.19 mmol/L (ref 1.15–1.40)
Chloride: 104 mmol/L (ref 98–111)
Creatinine, Ser: 0.7 mg/dL (ref 0.44–1.00)
Glucose, Bld: 100 mg/dL — ABNORMAL HIGH (ref 70–99)
HCT: 38 % (ref 36.0–46.0)
Hemoglobin: 12.9 g/dL (ref 12.0–15.0)
Potassium: 2.6 mmol/L — CL (ref 3.5–5.1)
Sodium: 141 mmol/L (ref 135–145)
TCO2: 22 mmol/L (ref 22–32)

## 2020-01-29 LAB — I-STAT BETA HCG BLOOD, ED (MC, WL, AP ONLY): I-stat hCG, quantitative: <5 m[IU]/mL (ref <5)

## 2020-01-29 MED ORDER — POTASSIUM CHLORIDE CRYS ER 20 MEQ PO TBCR
40.0000 meq | EXTENDED_RELEASE_TABLET | Freq: Once | ORAL | Status: AC
  Administered 2020-01-29: 40 meq via ORAL
  Filled 2020-01-29: qty 2

## 2020-01-29 MED ORDER — POTASSIUM CHLORIDE 10 MEQ/100ML IV SOLN
10.0000 meq | Freq: Once | INTRAVENOUS | Status: DC
  Filled 2020-01-29: qty 100

## 2020-01-29 MED ORDER — ALBUTEROL SULFATE (2.5 MG/3ML) 0.083% IN NEBU
5.0000 mg | INHALATION_SOLUTION | Freq: Once | RESPIRATORY_TRACT | Status: DC
  Filled 2020-01-29: qty 6

## 2020-01-29 MED ORDER — ALBUTEROL SULFATE HFA 108 (90 BASE) MCG/ACT IN AERS
8.0000 | INHALATION_SPRAY | Freq: Once | RESPIRATORY_TRACT | Status: AC
  Administered 2020-01-29: 8 via RESPIRATORY_TRACT
  Filled 2020-01-29: qty 6.7

## 2020-01-29 MED ORDER — POTASSIUM CHLORIDE 10 MEQ/100ML IV SOLN
10.0000 meq | INTRAVENOUS | Status: AC
  Administered 2020-01-29 (×2): 10 meq via INTRAVENOUS
  Filled 2020-01-29 (×2): qty 100

## 2020-01-29 MED ORDER — ALBUTEROL SULFATE (2.5 MG/3ML) 0.083% IN NEBU
INHALATION_SOLUTION | RESPIRATORY_TRACT | Status: AC
  Filled 2020-01-29: qty 3

## 2020-01-29 MED ORDER — MAGNESIUM SULFATE 50 % IJ SOLN: 2.0000 g | Freq: Once | INTRAMUSCULAR | Status: DC

## 2020-01-29 MED ORDER — SODIUM CHLORIDE 0.9 % IV BOLUS: 1000.0000 mL | Freq: Once | INTRAVENOUS | Status: DC

## 2020-01-29 MED ORDER — MAGNESIUM SULFATE 2 GM/50ML IV SOLN
2.0000 g | Freq: Once | INTRAVENOUS | Status: AC
  Administered 2020-01-29: 2 g via INTRAVENOUS
  Filled 2020-01-29: qty 50

## 2020-01-29 NOTE — ED Notes (Signed)
EKG printed and handed to Dr. Lockie Mola

## 2020-01-29 NOTE — ED Notes (Signed)
Radiology at pt bedside 

## 2020-01-29 NOTE — ED Triage Notes (Signed)
Pt brought in by EMS from home. Pt c/o x2 days sob, asthma attack, chest tightness pt has been using albuterol at home with no relief. Atrovent/ duo neb given on ems and 125mg  of solu-medrol.

## 2020-01-29 NOTE — ED Provider Notes (Signed)
[facility]-EMERGENCY DEPT Provider Note   CSN: [id number] Arrival date & time: 01/29/20  2033     History Chief Complaint  Patient presents with  . Asthma  . Shortness of Breath    Ashley Benton is a 24 y.o. female.  The history is provided by the patient.  Asthma This is a recurrent problem. The current episode started 2 days ago. Episode frequency: waxing and waning. The problem has been gradually improving. Associated symptoms include shortness of breath. Pertinent negatives include no chest pain, no abdominal pain and no headaches. The symptoms are aggravated by walking. The symptoms are relieved by medications (EMS gave steroids, albuterol). Treatments tried: inhaler. The treatment provided moderate relief.  Shortness of Breath Associated symptoms: wheezing   Associated symptoms: no abdominal pain, no chest pain, no cough, no ear pain, no fever, no headaches, no rash, no sore throat and no vomiting        Past Medical History:  Diagnosis Date  . Acid reflux   . Anemia   . Asthma     There are no problems to display for this patient.   History reviewed. No pertinent surgical history.   OB History   No obstetric history on file.     History reviewed. No pertinent family history.  Social History   Tobacco Use  . Smoking status: Former Games developer  . Smokeless tobacco: Never Used  Substance Use Topics  . Alcohol use: Yes  . Drug use: Not on file    Home Medications Prior to Admission medications   Medication Sig Start Date End Date Taking? Authorizing Provider  albuterol (PROVENTIL HFA;VENTOLIN HFA) 108 (90 Base) MCG/ACT inhaler Inhale 2 puffs into the lungs every 4 (four) hours as needed for wheezing or shortness of breath.   Yes [provider]  medroxyPROGESTERone (DEPO-PROVERA) 150 MG/ML injection Inject 150 mg into the muscle every 3 (three) months.   Yes [provider]  potassium chloride SA (KLOR-CON M20) 20 MEQ  tablet Take 1 tablet (20 mEq total) by mouth 2 (two) times daily for 3 days. 01/30/20 02/02/20  Oshae Simmering, DO  predniSONE (DELTASONE) 20 MG tablet Take 2 tablets (40 mg total) by mouth daily for 4 days. 01/30/20 02/03/20  Quianna Avery, DO    Allergies    Cephalexin, Molds & smuts, Other, Lactose, and Metronidazole  Review of Systems   Review of Systems  Constitutional: Negative for chills and fever.  HENT: Negative for ear pain and sore throat.   Eyes: Negative for pain and visual disturbance.  Respiratory: Positive for shortness of breath and wheezing. Negative for cough.   Cardiovascular: Negative for chest pain and palpitations.  Gastrointestinal: Negative for abdominal pain and vomiting.  Genitourinary: Negative for dysuria and hematuria.  Musculoskeletal: Negative for arthralgias and back pain.  Skin: Negative for color change and rash.  Neurological: Negative for seizures, syncope and headaches.  All other systems reviewed and are negative.   Physical Exam Updated Vital Signs BP 125/70   Pulse (!) 109   Temp 98.3 F (36.8 C) (Oral)   Resp (!) 25   LMP 01/22/2020 (Approximate)   SpO2 98%   Physical Exam Vitals and nursing note reviewed.  Constitutional:      General: She is not in acute distress.    Appearance: She is well-developed. She is not ill-appearing.  HENT:     Head: Normocephalic and atraumatic.     Mouth/Throat:     Mouth: Mucous membranes are moist.  Eyes:     Conjunctiva/sclera: Conjunctivae normal.     Pupils: Pupils are equal, round, and reactive to light.  Cardiovascular:     Rate and Rhythm: Normal rate and regular rhythm.     Pulses: Normal pulses.     Heart sounds: Normal heart sounds. No murmur.  Pulmonary:     Effort: Pulmonary effort is normal. Tachypnea present. No respiratory distress.     Breath sounds: Wheezing present.  Abdominal:     Palpations: Abdomen is soft.     Tenderness: There is no abdominal tenderness.    Musculoskeletal:        General: Normal range of motion.     Cervical back: Normal range of motion and neck supple.     Right lower leg: No edema.     Left lower leg: No edema.  Skin:    General: Skin is warm and dry.     Capillary Refill: Capillary refill takes less than 2 seconds.  Neurological:     General: No focal deficit present.     Mental Status: She is alert.  Psychiatric:        Mood and Affect: Mood normal.     ED Results / Procedures / Treatments   Labs (all labs ordered are listed, but only abnormal results are displayed) Labs Reviewed  I-STAT CHEM 8, ED - Abnormal; Notable for the following components:      Result Value   Potassium 2.6 (*)    BUN 4 (*)    Glucose, Bld 100 (*)    All other components within normal limits  SARS CORONAVIRUS 2 BY RT PCR (HOSPITAL ORDER, PERFORMED IN [facility])  I-STAT BETA HCG BLOOD, ED ([facility], [facility], [facility] ONLY)    EKG EKG Interpretation  Date/Time:  Friday Jan 29 2020 23:15:16 EDT Ventricular Rate:  107 PR Interval:    QRS Duration: 89 QT Interval:  415 QTC Calculation: 554 R Axis:   46 Text Interpretation: Sinus tachycardia Borderline T abnormalities, anterior leads Prolonged QT interval Confirmed by Virgina Norfolk [phone]) on 01/29/2020 11:20:50 PM   Radiology DG Chest 2 View  Result Date: 01/29/2020 CLINICAL DATA:  Shortness of breath for 2 days EXAM: CHEST - 2 VIEW COMPARISON:  08/08/2018 FINDINGS: Cardiac shadow is within normal limits. The lungs are well aerated bilaterally. No focal infiltrate or sizable effusion is seen. No bony abnormality is noted. IMPRESSION: No active cardiopulmonary disease. Electronically Signed   By: Alcide Clever M.D.   On: 01/29/2020 21:21    Procedures Procedures (including critical care time)  Medications Ordered in ED Medications  albuterol (PROVENTIL) (2.5 MG/3ML) 0.083% nebulizer solution 5 mg (5 mg Nebulization Not Given 01/29/20 2155)  sodium chloride 0.9 % bolus 1,000 mL  (has no administration in time range)  potassium chloride 10 mEq in 100 mL IVPB (has no administration in time range)  albuterol (VENTOLIN HFA) 108 (90 Base) MCG/ACT inhaler 8 puff (8 puffs Inhalation Given 01/29/20 2150)  magnesium sulfate IVPB 2 g 50 mL (0 g Intravenous Stopped 01/29/20 2258)  potassium chloride 10 mEq in 100 mL IVPB (0 mEq Intravenous Stopped 01/30/20 0000)  potassium chloride SA (KLOR-CON) CR tablet 40 mEq (40 mEq Oral Given 01/29/20 2158)    ED Course  I have reviewed the triage vital signs and the nursing notes.  Pertinent labs & imaging results that were available during my care of the patient were reviewed by me and considered in my medical decision making (see chart  for details).    MDM Rules/Calculators/A&P                      Ryliegh Mcduffey is a 24 year old female who presents to the ED with shortness of breath, wheezing.  History of asthma.  Not vaccinated against coronavirus.  Has not had coronavirus or any known close exposures.  Vital signs normal.  No fever.  Asthma type symptoms for the last 2 days.  Patient got albuterol, steroids with EMS and has had improvement.  Patient EKG that shows sinus rhythm.  Mildly prolonged QT at 516 and 550 (preK and Mag).  Patient with potassium of 2.6.  This was repleted and the patient was also given IV magnesium.  Pregnancy test negative.  Chest x-ray without signs of infection.  Patient tested for coronavirus.  Patient given further albuterol inhaler use as well.  Patient was evaluated multiple times and had great improvement of her symptoms.  Never hypoxic.  Air movement has improved.  Overall improved breath sounds and air movement.  No wheezing on reevaluation.  Will prescribe potassium as patient will likely continue to use albuterol for the next day or so.  Repeat EKG shown in the media tab as the not crossover into MUSE shows improved QTc interval to 490s.  Patient feels much better.  Will prescribe steroids.  Given return  precautions discharged from ED in good condition.  This chart was dictated using voice recognition software.  Despite best efforts to proofread,  errors can occur which can change the documentation meaning.     Final Clinical Impression(s) / ED Diagnoses Final diagnoses:  Mild asthma with exacerbation, unspecified whether persistent  Hypokalemia    Rx / DC Orders ED Discharge Orders         Ordered    predniSONE (DELTASONE) 20 MG tablet  Daily     01/30/20 0001    potassium chloride SA (KLOR-CON M20) 20 MEQ tablet  2 times daily     01/30/20 0001           Durene Dodge, DO 01/30/20 0001

## 2020-01-30 LAB — SARS CORONAVIRUS 2 BY RT PCR (HOSPITAL ORDER, PERFORMED IN ~~LOC~~ HOSPITAL LAB): SARS Coronavirus 2: NEGATIVE

## 2020-01-30 MED ORDER — POTASSIUM CHLORIDE CRYS ER 20 MEQ PO TBCR: 20.0000 meq | EXTENDED_RELEASE_TABLET | Freq: Two times a day (BID) | ORAL | 0 refills | Status: AC

## 2020-01-30 MED ORDER — PREDNISONE 20 MG PO TABS
40.0000 mg | ORAL_TABLET | Freq: Every day | ORAL | 0 refills | Status: AC
Start: 2020-01-30 — End: 2020-02-03

## 2020-09-21 ENCOUNTER — Encounter (HOSPITAL_BASED_OUTPATIENT_CLINIC_OR_DEPARTMENT_OTHER): Payer: Self-pay

## 2020-09-21 ENCOUNTER — Emergency Department (HOSPITAL_BASED_OUTPATIENT_CLINIC_OR_DEPARTMENT_OTHER)
Admission: EM | Admit: 2020-09-21 | Discharge: 2020-09-21 | Disposition: A | Discharge status: Home / Self Care | Payer: BLUE CROSS/BLUE SHIELD | Attending: Emergency Medicine | Admitting: Emergency Medicine

## 2020-09-21 ENCOUNTER — Emergency Department (HOSPITAL_BASED_OUTPATIENT_CLINIC_OR_DEPARTMENT_OTHER): Payer: BLUE CROSS/BLUE SHIELD

## 2020-09-21 ENCOUNTER — Other Ambulatory Visit: Payer: Self-pay

## 2020-09-21 DIAGNOSIS — O98511 Other viral diseases complicating pregnancy, first trimester: Secondary | ICD-10-CM | POA: Diagnosis not present

## 2020-09-21 DIAGNOSIS — Z3A10 10 weeks gestation of pregnancy: Secondary | ICD-10-CM | POA: Insufficient documentation

## 2020-09-21 DIAGNOSIS — J45909 Unspecified asthma, uncomplicated: Secondary | ICD-10-CM | POA: Insufficient documentation

## 2020-09-21 DIAGNOSIS — Z87891 Personal history of nicotine dependence: Secondary | ICD-10-CM | POA: Insufficient documentation

## 2020-09-21 DIAGNOSIS — U071 COVID-19: Secondary | ICD-10-CM | POA: Insufficient documentation

## 2020-09-21 DIAGNOSIS — O0289 Other abnormal products of conception: Secondary | ICD-10-CM

## 2020-09-21 DIAGNOSIS — O3680X0 Pregnancy with inconclusive fetal viability, not applicable or unspecified: Secondary | ICD-10-CM | POA: Insufficient documentation

## 2020-09-21 DIAGNOSIS — O99511 Diseases of the respiratory system complicating pregnancy, first trimester: Secondary | ICD-10-CM | POA: Diagnosis not present

## 2020-09-21 DIAGNOSIS — O4691 Antepartum hemorrhage, unspecified, first trimester: Secondary | ICD-10-CM | POA: Diagnosis present

## 2020-09-21 LAB — URINALYSIS, ROUTINE W REFLEX MICROSCOPIC
Bilirubin Urine: NEGATIVE
Glucose, UA: NEGATIVE mg/dL
Ketones, ur: 80 mg/dL — AB
Nitrite: POSITIVE — AB
Protein, ur: NEGATIVE mg/dL
Specific Gravity, Urine: 1.03 (ref 1.005–1.030)
pH: 6 (ref 5.0–8.0)

## 2020-09-21 LAB — CBC WITH DIFFERENTIAL/PLATELET
Abs Immature Granulocytes: 0.01 10*3/uL (ref 0.00–0.07)
Basophils Absolute: 0 10*3/uL (ref 0.0–0.1)
Basophils Relative: 1 %
Eosinophils Absolute: 0 10*3/uL (ref 0.0–0.5)
Eosinophils Relative: 1 %
HCT: 34.6 % — ABNORMAL LOW (ref 36.0–46.0)
Hemoglobin: 10.5 g/dL — ABNORMAL LOW (ref 12.0–15.0)
Immature Granulocytes: 0 %
Lymphocytes Relative: 32 %
Lymphs Abs: 1.3 10*3/uL (ref 0.7–4.0)
MCH: 21.4 pg — ABNORMAL LOW (ref 26.0–34.0)
MCHC: 30.3 g/dL (ref 30.0–36.0)
MCV: 70.5 fL — ABNORMAL LOW (ref 80.0–100.0)
Monocytes Absolute: 0.7 10*3/uL (ref 0.1–1.0)
Monocytes Relative: 17 %
Neutro Abs: 2 10*3/uL (ref 1.7–7.7)
Neutrophils Relative %: 49 %
Platelets: 277 10*3/uL (ref 150–400)
RBC: 4.91 MIL/uL (ref 3.87–5.11)
RDW: 17.6 % — ABNORMAL HIGH (ref 11.5–15.5)
WBC: 4.1 10*3/uL (ref 4.0–10.5)
nRBC: 0 % (ref 0.0–0.2)

## 2020-09-21 LAB — ABO/RH: ABO/RH(D): O POS

## 2020-09-21 LAB — SARS CORONAVIRUS 2 (TAT 6-24 HRS): SARS Coronavirus 2: POSITIVE — AB

## 2020-09-21 LAB — COMPREHENSIVE METABOLIC PANEL
ALT: 12 U/L (ref 0–44)
AST: 17 U/L (ref 15–41)
Albumin: 4.2 g/dL (ref 3.5–5.0)
Alkaline Phosphatase: 66 U/L (ref 38–126)
Anion gap: 10 (ref 5–15)
BUN: 9 mg/dL (ref 6–20)
CO2: 24 mmol/L (ref 22–32)
Calcium: 8.7 mg/dL — ABNORMAL LOW (ref 8.9–10.3)
Chloride: 102 mmol/L (ref 98–111)
Creatinine, Ser: 0.7 mg/dL (ref 0.44–1.00)
GFR, Estimated: >60 mL/min (ref >60)
Glucose, Bld: 82 mg/dL (ref 70–99)
Potassium: 3.3 mmol/L — ABNORMAL LOW (ref 3.5–5.1)
Sodium: 136 mmol/L (ref 135–145)
Total Bilirubin: 0.6 mg/dL (ref 0.3–1.2)
Total Protein: 7.6 g/dL (ref 6.5–8.1)

## 2020-09-21 LAB — HCG, QUANTITATIVE, PREGNANCY: hCG, Beta Chain, Quant, S: 7090 m[IU]/mL — ABNORMAL HIGH (ref <5)

## 2020-09-21 LAB — WET PREP, GENITAL
Clue Cells Wet Prep HPF POC: NONE SEEN
Sperm: NONE SEEN
Trich, Wet Prep: NONE SEEN
Yeast Wet Prep HPF POC: NONE SEEN

## 2020-09-21 LAB — URINALYSIS, MICROSCOPIC (REFLEX)

## 2020-09-21 LAB — LIPASE, BLOOD: Lipase: 26 U/L (ref 11–51)

## 2020-09-21 MED ORDER — FOSFOMYCIN TROMETHAMINE 3 G PO PACK
3.0000 g | PACK | Freq: Once | ORAL | Status: AC
  Administered 2020-09-21: 3 g via ORAL
  Filled 2020-09-21: qty 3

## 2020-09-21 NOTE — ED Notes (Signed)
Called patient to make her aware that she left her blanket, patient advises to throw it away

## 2020-09-21 NOTE — ED Notes (Signed)
ED Provider at bedside. 

## 2020-09-21 NOTE — ED Provider Notes (Signed)
Emergency Department Provider Note   I have reviewed the triage vital signs and the nursing notes.   HISTORY  Chief Complaint Vaginal Bleeding   HPI Ashley Benton is a 25 y.o. female G1P0 approximately 10 weeks by date presents to the ED with vaginal bleeding and foul urine odor.    Patient states that she was actually seen in late October with Dr. Posey Pronto with what she describes as vaginal bleeding and documented in [place] as symptoms resolved and sonogram at that time which confirmed IUP.  Patient denies significant pain but is having some foul smelling, dark urine.  Denies back pain, fevers, chills.  She does have some cough and shortness of breath with chest tightness when she is coughing mainly.  She does have concern for possible Covid infection.  She initially thought about going to see her OB/GYN when the bleeding returned but with her cough and respiratory symptoms she presents to the ED.  She does have history of asthma.    Past Medical History:  Diagnosis Date  . Acid reflux   . Anemia   . Asthma     There are no problems to display for this patient.   History reviewed. No pertinent surgical history.  Allergies Cephalexin, Molds & smuts, Other, Lactose, and Metronidazole  No family history on file.  Social History Social History   Tobacco Use  . Smoking status: Former Research scientist (life sciences)  . Smokeless tobacco: Never Used  Vaping Use  . Vaping Use: Never used  Substance Use Topics  . Alcohol use: Not Currently  . Drug use: Never    Review of Systems  Constitutional: No fever/chills. Positive fatigue.  Eyes: No visual changes. ENT: No sore throat. Cardiovascular: Denies chest pain. Respiratory: Positive shortness of breath and cough.  Gastrointestinal: No abdominal pain.  No nausea, no vomiting.  No diarrhea.  No constipation. Genitourinary: Negative for dysuria. Positive vaginal bleeding and foul-smelling urine.  Musculoskeletal: Negative for back  pain. Skin: Negative for rash. Neurological: Negative for headaches, focal weakness or numbness.  10-point ROS otherwise negative.  ____________________________________________   PHYSICAL EXAM:  VITAL SIGNS: ED Triage Vitals  Enc Vitals Group     BP 09/21/20 1019 101/68     Pulse Rate 09/21/20 1019 91     Resp 09/21/20 1019 18     Temp 09/21/20 1019 98.4 F (36.9 C)     Temp Source 09/21/20 1019 Oral     SpO2 09/21/20 1019 99 %     Weight 09/21/20 1023 132 lb (59.9 kg)     Height 09/21/20 1023 5\' 4"  (1.626 m)   Constitutional: Alert and oriented. Well appearing and in no acute distress. Eyes: Conjunctivae are normal. Head: Atraumatic. Nose: No congestion/rhinnorhea. Mouth/Throat: Mucous membranes are moist.  Neck: No stridor.   Cardiovascular: Normal rate, regular rhythm. Good peripheral circulation. Grossly normal heart sounds.   Respiratory: Normal respiratory effort.  No retractions. Lungs CTAB. Gastrointestinal: Soft and nontender. No distention.  Genitourinary: Exam performed with patient's verbal consent and nurse chaperone.  There is mild, dark red blood in the vaginal vault.  Cervix is visually closed.  No pooling, watery fluid.  Musculoskeletal: No gross deformities of extremities. Neurologic:  Normal speech and language. No gross focal neurologic deficits are appreciated.  Skin:  Skin is warm, dry and intact. No rash noted.  ____________________________________________   LABS (all labs ordered are listed, but only abnormal results are displayed)  Labs Reviewed  SARS CORONAVIRUS 2 (TAT 6-24 HRS) -  Abnormal; Notable for the following components:      Result Value   SARS Coronavirus 2 POSITIVE (*)    All other components within normal limits  WET PREP, GENITAL - Abnormal; Notable for the following components:   WBC, Wet Prep HPF POC MODERATE (*)    All other components within normal limits  COMPREHENSIVE METABOLIC PANEL - Abnormal; Notable for the following  components:   Potassium 3.3 (*)    Calcium 8.7 (*)    All other components within normal limits  CBC WITH DIFFERENTIAL/PLATELET - Abnormal; Notable for the following components:   Hemoglobin 10.5 (*)    HCT 34.6 (*)    MCV 70.5 (*)    MCH 21.4 (*)    RDW 17.6 (*)    All other components within normal limits  URINALYSIS, ROUTINE W REFLEX MICROSCOPIC - Abnormal; Notable for the following components:   APPearance CLOUDY (*)    Hgb urine dipstick MODERATE (*)    Ketones, ur 80 (*)    Nitrite POSITIVE (*)    Leukocytes,Ua TRACE (*)    All other components within normal limits  URINALYSIS, MICROSCOPIC (REFLEX) - Abnormal; Notable for the following components:   Bacteria, UA MANY (*)    All other components within normal limits  HCG, QUANTITATIVE, PREGNANCY - Abnormal; Notable for the following components:   hCG, Beta Chain, Quant, S 7,090 (*)    All other components within normal limits  URINE CULTURE  LIPASE, BLOOD  ABO/RH  GC/CHLAMYDIA PROBE AMP ([place]) NOT AT [facility]   ____________________________________________  EKG  None performed  ____________________________________________  RADIOLOGY  DG Chest Portable 1 View  Result Date: 09/21/2020 CLINICAL DATA:  Increasing shortness of breath and cough EXAM: PORTABLE CHEST 1 VIEW COMPARISON:  04/23/2020 FINDINGS: The heart size and mediastinal contours are within normal limits. Both lungs are clear. The visualized skeletal structures are unremarkable. IMPRESSION: No acute cardiopulmonary disease. Electronically Signed   By: Acquanetta Belling M.D.   On: 09/21/2020 13:11    ____________________________________________   PROCEDURES  Procedure(s) performed:   Procedures  None  ____________________________________________   INITIAL IMPRESSION / ASSESSMENT AND PLAN / ED COURSE  Pertinent labs & imaging results that were available during my care of the patient were reviewed by me and considered in my medical decision making (see  chart for details).   Patient presents emergency department with vaginal bleeding.  In chart review I see that she was seen by her OB/GYN at [facility], Dr. Allena Katz, in late December with threatened abortion at that time as well.  Her urine does seem consistent with urinary tract infection.  She does have a listed allergy to Keflex which includes hives.  After consultation with pharmacy will give a single dose of fosfomycin here.  Will repeat transvaginal ultrasound with return of bleeding. Patient's blood work from [facility] shows Rh+ status so no Rhogam.  Patient will be tested for Covid but results will not be back for 6 to 24 hours.  Patient will follow the test results in the MyChart app.   TVUS pending. Care transferred to Dr. Madilyn Hook. See her note for additional MDM and disposition plan.  ____________________________________________  FINAL CLINICAL IMPRESSION(S) / ED DIAGNOSES  Final diagnoses:  Nonviable pregnancy    MEDICATIONS GIVEN DURING THIS VISIT:  Medications  fosfomycin (MONUROL) packet 3 g (3 g Oral Given 09/21/20 1616)    Note:  This document was prepared using Dragon voice recognition software and may include unintentional dictation errors.  Alona Bene, MD, [facility]    Mekesha Solomon, Arlyss Repress, MD 09/23/20 [phone]

## 2020-09-21 NOTE — Progress Notes (Signed)
12 Lead EKG completed at 1200.

## 2020-09-21 NOTE — ED Triage Notes (Signed)
Pt arrives with concern of vaginal bleeding starting last night states that she is [redacted] weeks pregnant. Pt also reports concern for UTI. Also reports increased SOB with reports of being around a lot of smoke with family members for the last few days. EDD 04/19/21

## 2020-09-21 NOTE — ED Provider Notes (Signed)
Patient care assumed at 1500. Patient approximately [redacted] weeks pregnant here for evaluation of vaginal bleeding. She also has a UTI was treated with abx in the ED. pelvic ultrasound consistent with nonviable pregnancy. Discussed with patient findings of studies. Discussed importance of OB/GYN follow-up as well as return precautions.   Tilden Fossa, MD 09/21/20 [phone]

## 2020-09-21 NOTE — ED Notes (Signed)
Pt here with complaints of vaginal bleeding [redacted] weeks pregnant. Cough and uti.

## 2020-09-22 ENCOUNTER — Inpatient Hospital Stay (HOSPITAL_COMMUNITY)
Admission: AD | Admit: 2020-09-22 | Discharge: 2020-09-22 | Disposition: A | Discharge status: Home / Self Care | Payer: BLUE CROSS/BLUE SHIELD | Attending: Family Medicine | Admitting: Family Medicine

## 2020-09-22 ENCOUNTER — Other Ambulatory Visit: Payer: Self-pay

## 2020-09-22 ENCOUNTER — Encounter (HOSPITAL_COMMUNITY): Payer: Self-pay | Admitting: Family Medicine

## 2020-09-22 DIAGNOSIS — O26891 Other specified pregnancy related conditions, first trimester: Secondary | ICD-10-CM | POA: Insufficient documentation

## 2020-09-22 DIAGNOSIS — O98511 Other viral diseases complicating pregnancy, first trimester: Secondary | ICD-10-CM | POA: Diagnosis not present

## 2020-09-22 DIAGNOSIS — Z3A10 10 weeks gestation of pregnancy: Secondary | ICD-10-CM | POA: Diagnosis not present

## 2020-09-22 DIAGNOSIS — U071 COVID-19: Secondary | ICD-10-CM | POA: Diagnosis not present

## 2020-09-22 DIAGNOSIS — O039 Complete or unspecified spontaneous abortion without complication: Secondary | ICD-10-CM | POA: Diagnosis not present

## 2020-09-22 DIAGNOSIS — Z87891 Personal history of nicotine dependence: Secondary | ICD-10-CM | POA: Insufficient documentation

## 2020-09-22 LAB — CBC
HCT: 34.5 % — ABNORMAL LOW (ref 36.0–46.0)
Hemoglobin: 10.5 g/dL — ABNORMAL LOW (ref 12.0–15.0)
MCH: 21.3 pg — ABNORMAL LOW (ref 26.0–34.0)
MCHC: 30.4 g/dL (ref 30.0–36.0)
MCV: 70 fL — ABNORMAL LOW (ref 80.0–100.0)
Platelets: 290 10*3/uL (ref 150–400)
RBC: 4.93 MIL/uL (ref 3.87–5.11)
RDW: 17.3 % — ABNORMAL HIGH (ref 11.5–15.5)
WBC: 7 10*3/uL (ref 4.0–10.5)
nRBC: 0 % (ref 0.0–0.2)

## 2020-09-22 LAB — GC/CHLAMYDIA PROBE AMP (~~LOC~~) NOT AT ARMC
Chlamydia: NEGATIVE
Comment: NEGATIVE
Comment: NORMAL
Neisseria Gonorrhea: NEGATIVE

## 2020-09-22 MED ORDER — OXYCODONE HCL 5 MG PO TABS
10.0000 mg | ORAL_TABLET | Freq: Once | ORAL | Status: AC
  Administered 2020-09-22: 10 mg via ORAL
  Filled 2020-09-22: qty 2

## 2020-09-22 MED ORDER — MISOPROSTOL 200 MCG PO TABS
800.0000 ug | ORAL_TABLET | Freq: Once | ORAL | Status: AC
  Administered 2020-09-22: 800 ug via ORAL
  Filled 2020-09-22: qty 4

## 2020-09-22 MED ORDER — IBUPROFEN 800 MG PO TABS: 800.0000 mg | ORAL_TABLET | Freq: Three times a day (TID) | ORAL | 0 refills | Status: DC | PRN

## 2020-09-22 MED ORDER — OXYCODONE-ACETAMINOPHEN 5-325 MG PO TABS: 2.0000 | ORAL_TABLET | ORAL | 0 refills | Status: DC | PRN

## 2020-09-22 MED ORDER — IBUPROFEN 800 MG PO TABS
800.0000 mg | ORAL_TABLET | Freq: Once | ORAL | Status: AC
  Administered 2020-09-22: 800 mg via ORAL
  Filled 2020-09-22: qty 1

## 2020-09-22 NOTE — MAU Provider Note (Signed)
History     [id number]  Arrival date and time: 09/22/20 1929    Chief Complaint  Patient presents with  . Abdominal Pain  . Vaginal Bleeding     HPI Ashley Benton is a 25 y.o. at [redacted]w[redacted]d by early [place] with PMHx notable for asthma, who presents for abdominal pain and bleeding.   Patient seen at [facility] yesterday and diagnosed with miscarriage Also diagnosed with UTI and treated with fosfomycin Given return precautions and discharged to home  Reports that she started to have bleeding that was light but it has gotten progressively heavier Was changing multiple pads an hour Also having increasing rate of contractions that have become more painful Decided she could no longer take the pain and called EMS In between contractions does not have pain  Also diagnosed with COVID yesterday No chest pain or SOB   --/--/O POS (01/05 1231)  OB History    Gravida  1   Para      Term      Preterm      AB      Living        SAB      IAB      Ectopic      Multiple      Live Births              Past Medical History:  Diagnosis Date  . Acid reflux   . Anemia   . Asthma     History reviewed. No pertinent surgical history.  History reviewed. No pertinent family history.  Social History   Socioeconomic History  . Marital status: Single    Spouse name: Not on file  . Number of children: Not on file  . Years of education: Not on file  . Highest education level: Not on file  Occupational History  . Not on file  Tobacco Use  . Smoking status: Former Games developer  . Smokeless tobacco: Never Used  Vaping Use  . Vaping Use: Never used  Substance and Sexual Activity  . Alcohol use: Not Currently  . Drug use: Never  . Sexual activity: Yes  Other Topics Concern  . Not on file  Social History Narrative  . Not on file   Social Determinants of Health   Financial Resource Strain: Not on file  Food Insecurity: Not on file  Transportation Needs: Not on file  Physical  Activity: Not on file  Stress: Not on file  Social Connections: Not on file  Intimate Partner Violence: Not on file    Allergies  Allergen Reactions  . Cephalexin Hives  . Molds & Smuts Cough and Shortness Of Breath    asthma   . Other Diarrhea, Nausea And Vomiting and Other (See Comments)    Dairy Products; Constipation  Cats; Asthma   . Lactose Nausea And Vomiting    Constipation  . Metronidazole Hives    No current facility-administered medications on file prior to encounter.   Current Outpatient Medications on File Prior to Encounter  Medication Sig Dispense Refill  . albuterol (PROVENTIL HFA;VENTOLIN HFA) 108 (90 Base) MCG/ACT inhaler Inhale 2 puffs into the lungs every 4 (four) hours as needed for wheezing or shortness of breath.    . fosfomycin (MONUROL) 3 g PACK Take 3 g by mouth once.    . medroxyPROGESTERone (DEPO-PROVERA) 150 MG/ML injection Inject 150 mg into the muscle every 3 (three) months.    . potassium chloride SA (KLOR-CON M20) 20 MEQ tablet Take  1 tablet (20 mEq total) by mouth 2 (two) times daily for 3 days. 6 tablet 0     ROS Pertinent positives and negative per HPI, all others reviewed and negative  Physical Exam   BP 112/63   Pulse 76   Temp 99.5 F (37.5 C) (Oral)   Resp 12   Ht 5\' 4"  (1.626 m)   Wt 59.9 kg   LMP 01/22/2020 (Approximate)   SpO2 100%   BMI 22.66 kg/m   Physical Exam Vitals reviewed.  Constitutional:      General: She is not in acute distress.    Appearance: She is well-developed and well-nourished. She is not diaphoretic.  Eyes:     General: No scleral icterus. Pulmonary:     Effort: Pulmonary effort is normal. No respiratory distress.  Abdominal:     General: There is no distension.     Palpations: Abdomen is soft.     Tenderness: There is abdominal tenderness. There is no guarding or rebound.     Comments: Mild tenderness to palpation of lower abdomen  Genitourinary:    Comments: Moderate amount of blood in the  vaginal vault, no POC seen, no active bleeding seen from os Musculoskeletal:        General: No edema.  Skin:    General: Skin is warm and dry.  Neurological:     Mental Status: She is alert.     Coordination: Coordination normal.  Psychiatric:        Mood and Affect: Mood and affect normal.      Cervical Exam    Bedside Ultrasound Not done  My interpretation: n/a  FHT n/a  Labs No results found for this or any previous visit (from the past 24 hour(s)).  Imaging No results found.  MAU Course  Procedures  Lab Orders     CBC Meds ordered this encounter  Medications  . misoprostol (CYTOTEC) tablet 800 mcg  . oxyCODONE (Oxy IR/ROXICODONE) immediate release tablet 10 mg  . ibuprofen (ADVIL) tablet 800 mg   Imaging Orders  No imaging studies ordered today    MDM moderate  Assessment and Plan  #Miscarriage Patient presenting with pain and bleeding. Exam c/w miscarriage, vital signs reassuring, no red flags on exam. Reassured patient that symptoms are c/w miscarriage. CBC checked and was unchanged from yesterday. Offered misoprostol which she accepted, pain treated with oxycodone and ibuprofen.  Reviewed warning signs.  D/c to home in stable condition.    03/23/2020

## 2020-09-22 NOTE — MAU Note (Signed)
Pt arrives to MAU via EMS for vaginal bleeding, increasing in amount over the last 24 hours.  Pt reports spotting began approx 3 days ago. Pt reports positive COVID test results received this morning after appt. At [facility].  Pt denies any vomiting or diarrhea, reports nausea.  Pt denies any chest pain, fever.  Pt reports intermittent cough and SOB. Pt took 6 x 500 mg tylenol today.

## 2020-09-22 NOTE — Discharge Instructions (Signed)
Miscarriage A miscarriage is the loss of an unborn baby (fetus) before the 20th week of pregnancy. Follow these instructions at home: Medicines   Take over-the-counter and prescription medicines only as told by your doctor.  If you were prescribed antibiotic medicine, take it as told by your doctor. Do not stop taking the antibiotic even if you start to feel better.  Do not take NSAIDs unless your doctor says that this is safe for you. NSAIDs include aspirin and ibuprofen. These medicines can cause bleeding. Activity  Rest as directed. Ask your doctor what activities are safe for you.  Have someone help you at home during this time. General instructions  Write down how many pads you use each day and how soaked they are.  Watch the amount of tissue or clumps of blood (blood clots) that you pass from your vagina. Save any large amounts of tissue for your doctor.  Do not use tampons, douche, or have sex until your doctor approves.  To help you and your partner with the process of grieving, talk with your doctor or seek counseling.  When you are ready, meet with your doctor to talk about steps you should take for your health. Also, talk with your doctor about steps to take to have a healthy pregnancy in the future.  Keep all follow-up visits as told by your doctor. This is important. Contact a doctor if:  You have a fever or chills.  You have vaginal discharge that smells bad.  You have more bleeding. Get help right away if:  You have very bad cramps or pain in your back or belly.  You pass clumps of blood that are walnut-sized or larger from your vagina.  You pass tissue that is walnut-sized or larger from your vagina.  You soak more than 1 regular pad in an hour.  You get light-headed or weak.  You faint (pass out).  You have feelings of sadness that do not go away, or you have thoughts of hurting yourself. Summary  A miscarriage is the loss of an unborn baby before  the 20th week of pregnancy.  Follow your doctor's instructions for home care. Keep all follow-up appointments.  To help you and your partner with the process of grieving, talk with your doctor or seek counseling. This information is not intended to replace advice given to you by your health care provider. Make sure you discuss any questions you have with your health care provider. Document Revised: 12/26/2018 Document Reviewed: 10/09/2016 Elsevier Patient Education  2020 [employer]  

## 2020-09-23 ENCOUNTER — Telehealth: Payer: Self-pay | Admitting: Certified Nurse Midwife

## 2020-09-23 DIAGNOSIS — O039 Complete or unspecified spontaneous abortion without complication: Secondary | ICD-10-CM

## 2020-09-23 MED ORDER — OXYCODONE-ACETAMINOPHEN 5-325 MG PO TABS: 1.0000 | ORAL_TABLET | ORAL | 0 refills | Status: AC | PRN

## 2020-09-23 MED ORDER — IBUPROFEN 600 MG PO TABS: 600.0000 mg | ORAL_TABLET | Freq: Four times a day (QID) | ORAL | 3 refills | Status: DC | PRN

## 2020-09-23 NOTE — Telephone Encounter (Signed)
Pt called in and reported that her medications (ibuprofen and oxycodone) were not at her pharmacy this morning when she went to pick them up. I called [facility] to confirm and the pharmacist said she'd received a message to cancel the prescriptions because they were being sent to [facility]. Both prescriptions were canceled, but it was verified that only ibuprofen had been resent to [facility]. Pt requests that both prescriptions be sent to [facility]. Both sent at 0940.  Edd Arbour, CNM, MSN, [facility] 09/23/20 9:37 AM

## 2020-09-24 LAB — URINE CULTURE: Culture: >=100000 — AB

## 2021-07-27 ENCOUNTER — Other Ambulatory Visit: Payer: Self-pay

## 2021-07-27 ENCOUNTER — Emergency Department (HOSPITAL_BASED_OUTPATIENT_CLINIC_OR_DEPARTMENT_OTHER)
Admission: EM | Admit: 2021-07-27 | Discharge: 2021-07-27 | Disposition: A | Discharge status: Home / Self Care | Payer: Medicaid Other | Attending: Emergency Medicine | Admitting: Emergency Medicine

## 2021-07-27 ENCOUNTER — Encounter (HOSPITAL_BASED_OUTPATIENT_CLINIC_OR_DEPARTMENT_OTHER): Payer: Self-pay | Admitting: Emergency Medicine

## 2021-07-27 DIAGNOSIS — Z7951 Long term (current) use of inhaled steroids: Secondary | ICD-10-CM | POA: Insufficient documentation

## 2021-07-27 DIAGNOSIS — Z20822 Contact with and (suspected) exposure to covid-19: Secondary | ICD-10-CM | POA: Insufficient documentation

## 2021-07-27 DIAGNOSIS — J101 Influenza due to other identified influenza virus with other respiratory manifestations: Secondary | ICD-10-CM | POA: Diagnosis not present

## 2021-07-27 DIAGNOSIS — J4521 Mild intermittent asthma with (acute) exacerbation: Secondary | ICD-10-CM | POA: Insufficient documentation

## 2021-07-27 DIAGNOSIS — R0602 Shortness of breath: Secondary | ICD-10-CM | POA: Diagnosis present

## 2021-07-27 LAB — RESP PANEL BY RT-PCR (FLU A&B, COVID) ARPGX2
Influenza A by PCR: POSITIVE — AB
Influenza B by PCR: NEGATIVE
SARS Coronavirus 2 by RT PCR: NEGATIVE

## 2021-07-27 MED ORDER — DEXAMETHASONE SODIUM PHOSPHATE 10 MG/ML IJ SOLN
10.0000 mg | Freq: Once | INTRAMUSCULAR | Status: AC
  Administered 2021-07-27: 10 mg via INTRAMUSCULAR
  Filled 2021-07-27: qty 1

## 2021-07-27 MED ORDER — AEROCHAMBER PLUS FLO-VU MEDIUM MISC
1.0000 | Freq: Once | Status: AC
  Administered 2021-07-27: 1
  Filled 2021-07-27: qty 1

## 2021-07-27 MED ORDER — IBUPROFEN 600 MG PO TABS: 600.0000 mg | ORAL_TABLET | Freq: Four times a day (QID) | ORAL | 0 refills | Status: AC | PRN

## 2021-07-27 MED ORDER — ALBUTEROL SULFATE HFA 108 (90 BASE) MCG/ACT IN AERS
2.0000 | INHALATION_SPRAY | RESPIRATORY_TRACT | Status: DC | PRN
  Administered 2021-07-27: 2 via RESPIRATORY_TRACT
  Filled 2021-07-27: qty 6.7

## 2021-07-27 MED ORDER — OSELTAMIVIR PHOSPHATE 75 MG PO CAPS: 75.0000 mg | ORAL_CAPSULE | Freq: Two times a day (BID) | ORAL | 0 refills | Status: AC

## 2021-07-27 MED ORDER — IPRATROPIUM-ALBUTEROL 0.5-2.5 (3) MG/3ML IN SOLN
3.0000 mL | Freq: Once | RESPIRATORY_TRACT | Status: AC
  Administered 2021-07-27: 3 mL via RESPIRATORY_TRACT
  Filled 2021-07-27: qty 3

## 2021-07-27 MED ORDER — PREDNISONE 10 MG (21) PO TBPK: ORAL_TABLET | Freq: Every day | ORAL | 0 refills | Status: AC

## 2021-07-27 NOTE — ED Triage Notes (Signed)
Shortness of breath and cough , Hx asthma . No relief despite inhaler and breathing treatment .

## 2021-07-27 NOTE — ED Provider Notes (Signed)
[facility] EMERGENCY DEPARTMENT Provider Note   CSN: [id number] Arrival date & time: 07/27/21  1126     History Chief Complaint  Patient presents with   Shortness of Breath    Hilma Steinhilber is a 25 y.o. female.  Pt presents to the ED today with sob.  The pt said she has a hx of asthma, but has not been feeling better with her nebs.  Pt has not had a fever, but feels hot.  She has not been vaccinated against the flu.  She has had the first 2 doses of the Covid vaccine, but not boosters.        Past Medical History:  Diagnosis Date   Acid reflux    Anemia    Asthma     There are no problems to display for this patient.   History reviewed. No pertinent surgical history.   OB History     Gravida  1   Para      Term      Preterm      AB      Living         SAB      IAB      Ectopic      Multiple      Live Births              No family history on file.  Social History   Tobacco Use   Smoking status: Former   Smokeless tobacco: Never  Building services engineer Use: Never used  Substance Use Topics   Alcohol use: Not Currently   Drug use: Never    Home Medications Prior to Admission medications   Medication Sig Start Date End Date Taking? Authorizing Provider  ibuprofen (ADVIL) 600 MG tablet Take 1 tablet (600 mg total) by mouth every 6 (six) hours as needed. 07/27/21  Yes Jacalyn Lefevre, MD  oseltamivir (TAMIFLU) 75 MG capsule Take 1 capsule (75 mg total) by mouth every 12 (twelve) hours. 07/27/21  Yes Jacalyn Lefevre, MD  oxyCODONE-acetaminophen (PERCOCET/ROXICET) 5-325 MG tablet Take 1 tablet by mouth every 4 (four) hours as needed for severe pain. 09/23/20   Bernerd Limbo, CNM  predniSONE (STERAPRED UNI-PAK 21 TAB) 10 MG (21) TBPK tablet Take by mouth daily. Take 6 tabs by mouth daily  for 2 days, then 5 tabs for 2 days, then 4 tabs for 2 days, then 3 tabs for 2 days, 2 tabs for 2 days, then 1 tab by mouth daily for 2 days  07/27/21  Yes Jacalyn Lefevre, MD  albuterol (PROVENTIL HFA;VENTOLIN HFA) 108 (90 Base) MCG/ACT inhaler Inhale 2 puffs into the lungs every 4 (four) hours as needed for wheezing or shortness of breath.    [provider]  fosfomycin (MONUROL) 3 g PACK Take 3 g by mouth once.    [provider]  medroxyPROGESTERone (DEPO-PROVERA) 150 MG/ML injection Inject 150 mg into the muscle every 3 (three) months.    [provider]  potassium chloride SA (KLOR-CON M20) 20 MEQ tablet Take 1 tablet (20 mEq total) by mouth 2 (two) times daily for 3 days. 01/30/20 02/02/20  Virgina Norfolk, DO    Allergies    Cephalexin, Molds & smuts, Other, Lactose, and Metronidazole  Review of Systems   Review of Systems  Respiratory:  Positive for cough, shortness of breath and wheezing.   All other systems reviewed and are negative.  Physical Exam Updated Vital Signs BP 116/63 (  BP Location: Left Arm)   Pulse 100   Temp 98.5 F (36.9 C) (Oral)   Resp 18   Ht 5\' 4"  (1.626 m)   Wt 52.2 kg   LMP 07/14/2021 (Exact Date)   SpO2 100%   BMI 19.74 kg/m   Physical Exam Vitals and nursing note reviewed.  Constitutional:      Appearance: She is well-developed.  HENT:     Head: Normocephalic and atraumatic.     Mouth/Throat:     Mouth: Mucous membranes are moist.     Pharynx: Oropharynx is clear.  Eyes:     Extraocular Movements: Extraocular movements intact.     Pupils: Pupils are equal, round, and reactive to light.  Cardiovascular:     Rate and Rhythm: Normal rate and regular rhythm.  Pulmonary:     Effort: Pulmonary effort is normal.     Breath sounds: Wheezing present.  Abdominal:     General: Bowel sounds are normal.     Palpations: Abdomen is soft.  Musculoskeletal:        General: Normal range of motion.     Cervical back: Normal range of motion and neck supple.  Skin:    General: Skin is warm.     Capillary Refill: Capillary refill takes less than 2 seconds.   Neurological:     General: No focal deficit present.     Mental Status: She is alert and oriented to person, place, and time.  Psychiatric:        Mood and Affect: Mood normal.        Behavior: Behavior normal.    ED Results / Procedures / Treatments   Labs (all labs ordered are listed, but only abnormal results are displayed) Labs Reviewed  RESP PANEL BY RT-PCR (FLU A&B, COVID) ARPGX2 - Abnormal; Notable for the following components:      Result Value   Influenza A by PCR POSITIVE (*)    All other components within normal limits    EKG None  Radiology No results found.  Procedures Procedures   Medications Ordered in ED Medications  albuterol (VENTOLIN HFA) 108 (90 Base) MCG/ACT inhaler 2 puff (has no administration in time range)  AeroChamber Plus Flo-Vu Medium MISC 1 each (has no administration in time range)  ipratropium-albuterol (DUONEB) 0.5-2.5 (3) MG/3ML nebulizer solution 3 mL (3 mLs Nebulization Given 07/27/21 1223)  dexamethasone (DECADRON) injection 10 mg (10 mg Intramuscular Given 07/27/21 1209)    ED Course  I have reviewed the triage vital signs and the nursing notes.  Pertinent labs & imaging results that were available during my care of the patient were reviewed by me and considered in my medical decision making (see chart for details).    MDM Rules/Calculators/A&P                           Pt is + for the flu.  She is feeling better after steroids and the neb.  She is given an albuterol inhaler + spacer.    She is stable for d/c.  Return if worse.  F/u with pcp. Final Clinical Impression(s) / ED Diagnoses Final diagnoses:  Influenza A  Mild intermittent asthma with exacerbation    Rx / DC Orders ED Discharge Orders          Ordered    oseltamivir (TAMIFLU) 75 MG capsule  Every 12 hours        07/27/21 1310  ibuprofen (ADVIL) 600 MG tablet  Every 6 hours PRN        07/27/21 1310    predniSONE (STERAPRED UNI-PAK 21 TAB) 10 MG (21) TBPK  tablet  Daily        07/27/21 1311             Jacalyn Lefevre, MD 07/27/21 1312

## 2022-08-25 IMAGING — US US OB COMP LESS 14 WK
1 series · 14 of 28 positions shown · non-contrast
Comparison: September 07, 2020

CLINICAL DATA: Vaginal bleeding.

EXAM:
OBSTETRIC <14 WK ULTRASOUND
TECHNIQUE: Transabdominal ultrasound was performed for evaluation of the
gestation as well as the maternal uterus and adnexal regions.

[Series 1: us ob comp less 14 wk · 70 acquisitions, 14 frames shown]
[im 3/70]
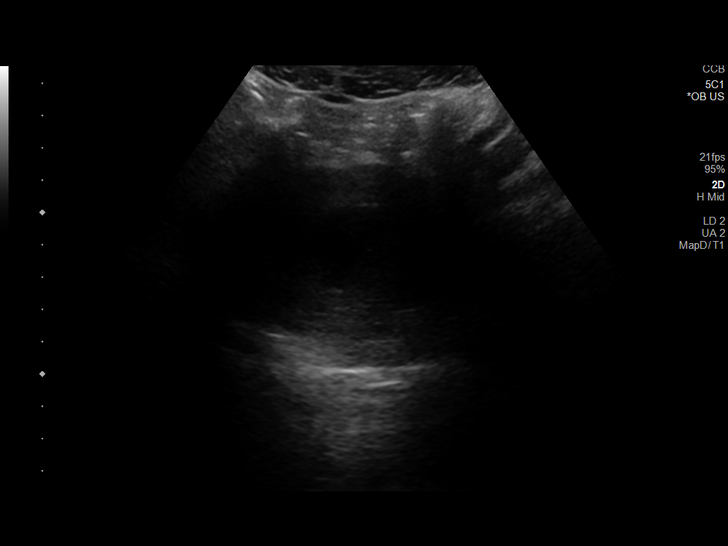
[im 8/70]
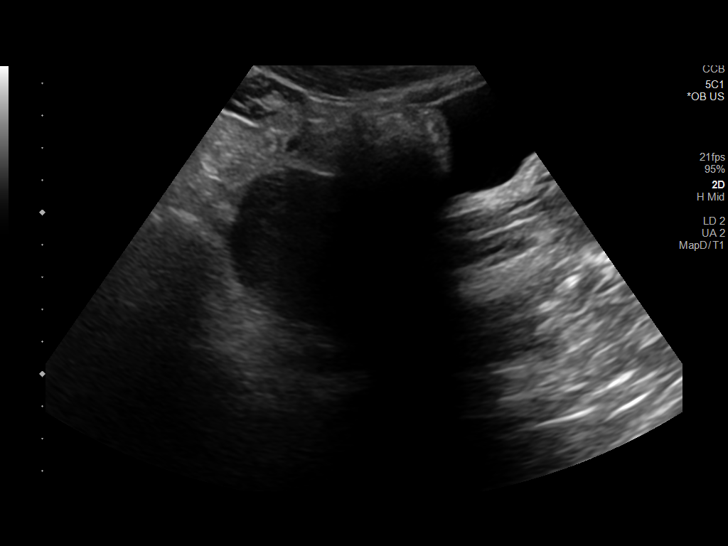
[im 13/70]
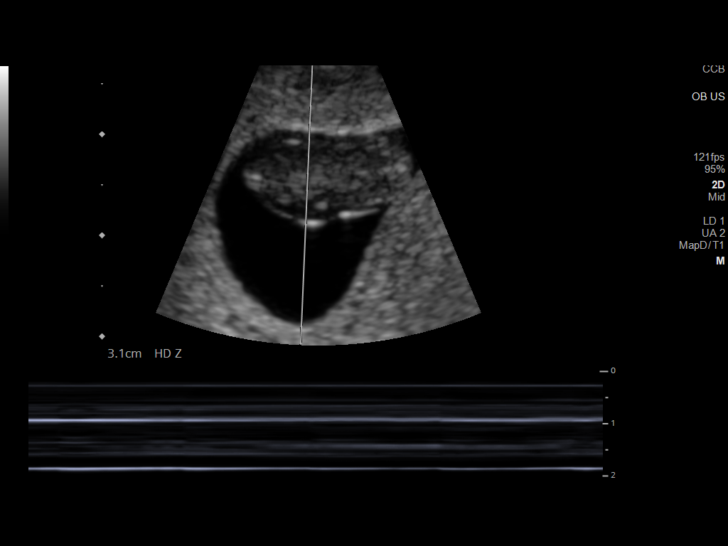
[im 18/70]
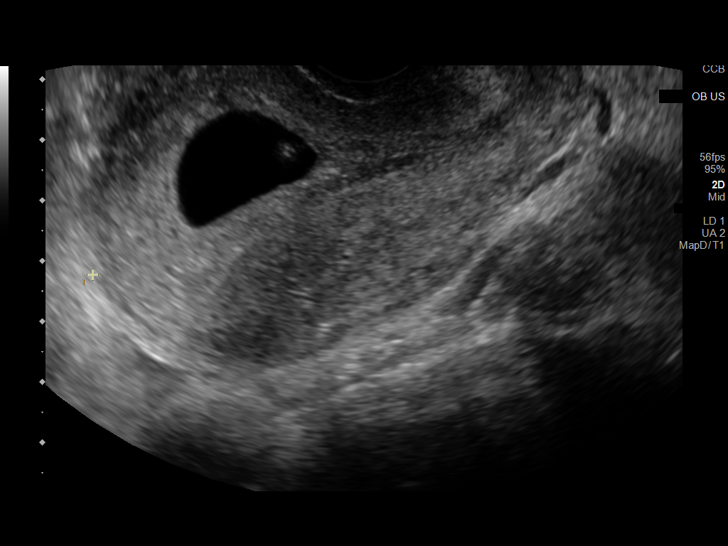
[im 24/70]
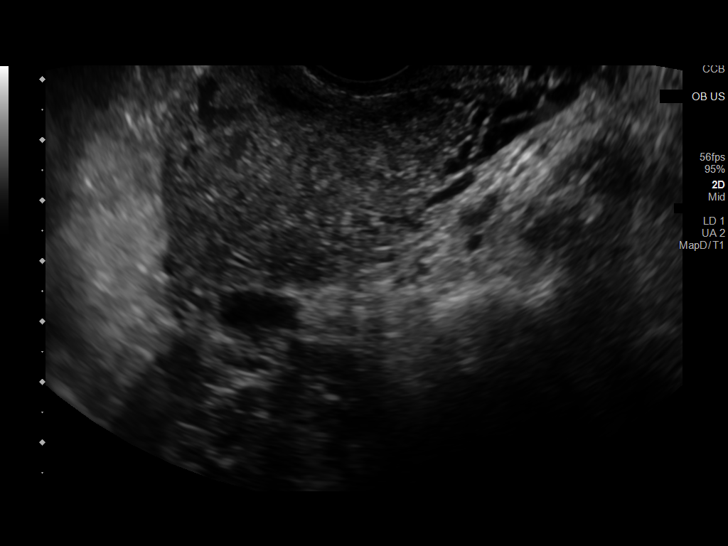
[im 29/70]
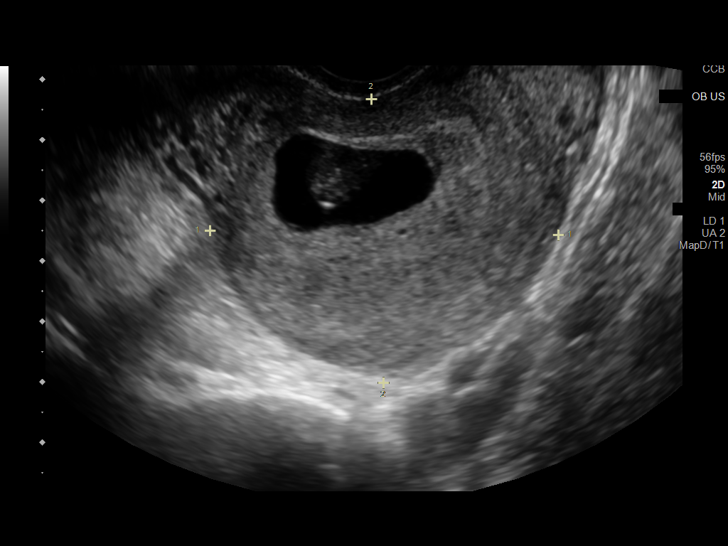
[im 34/70]
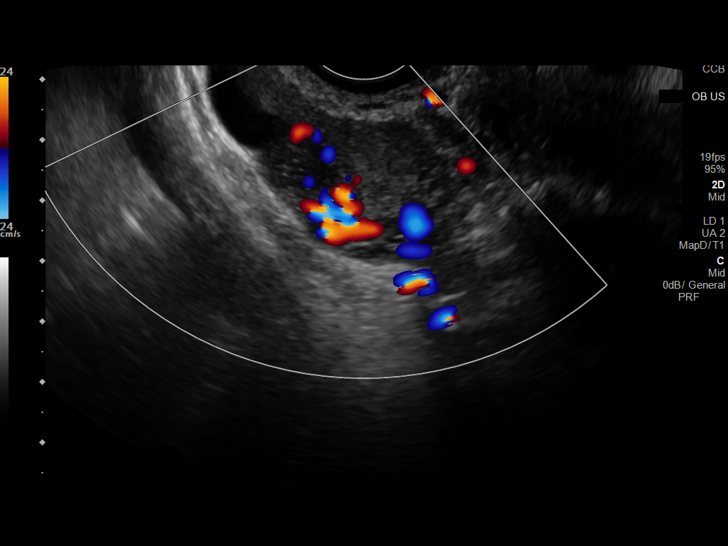
[im 39/70]
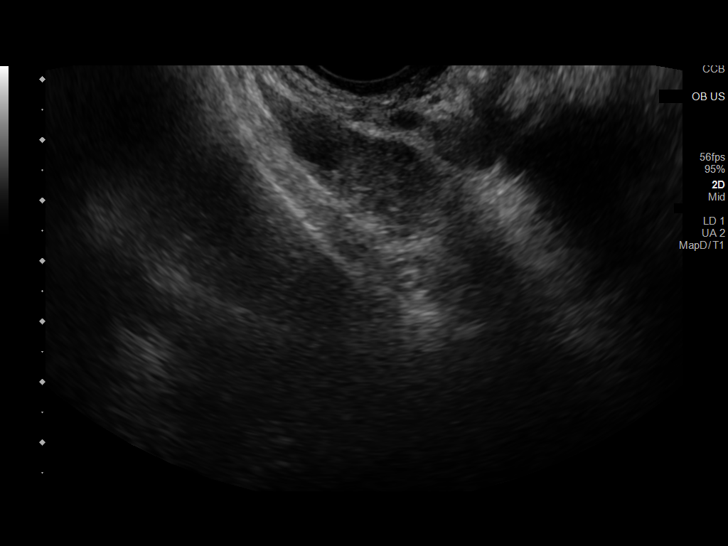
[im 44/70]
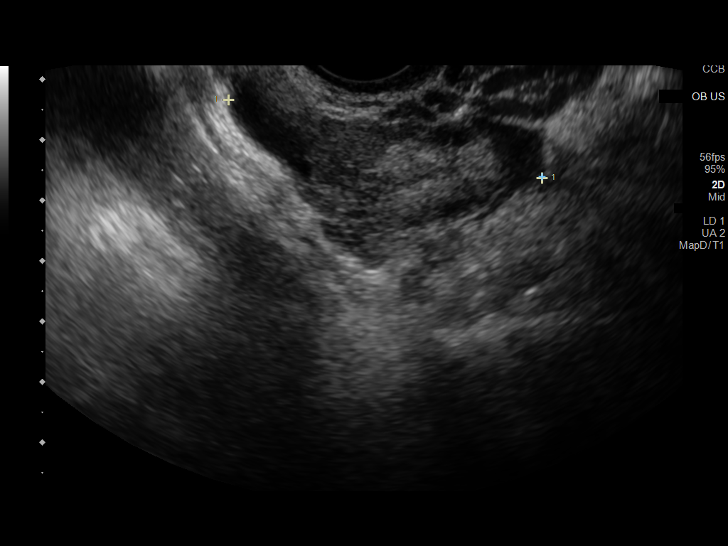
[im 49/70]
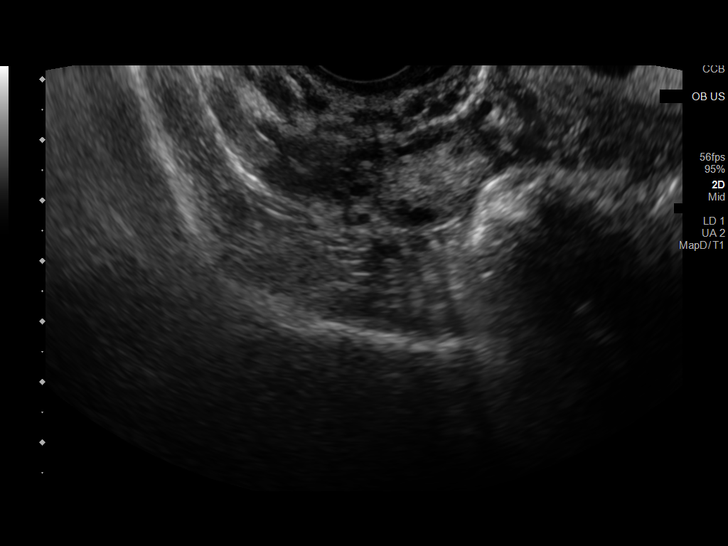
[im 54/70]
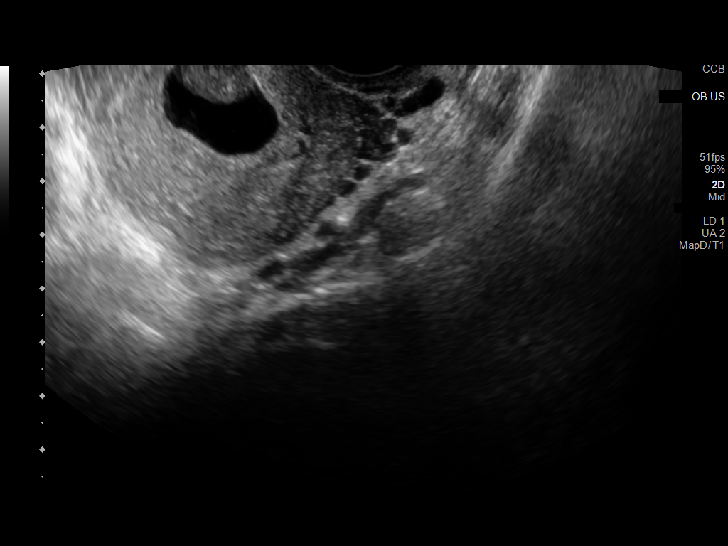
[im 59/70]
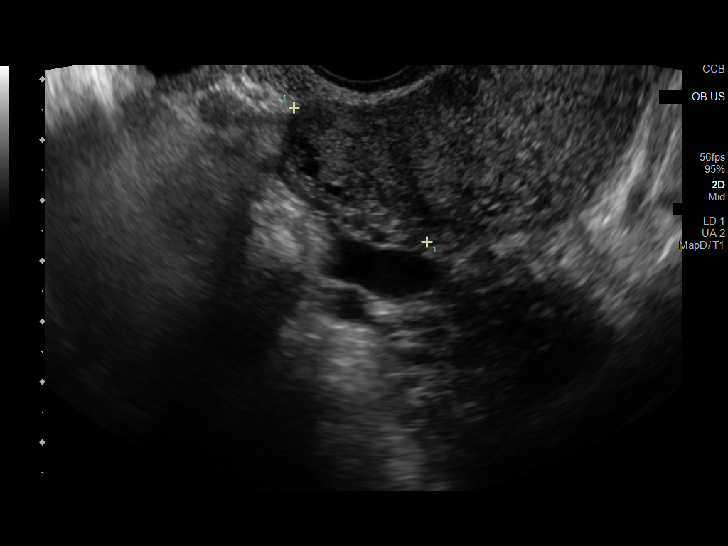
[im 64/70]
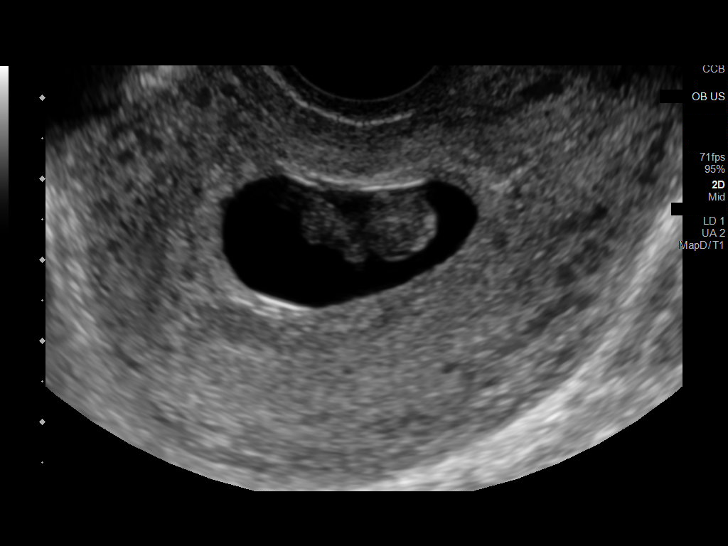
[im 70/70]
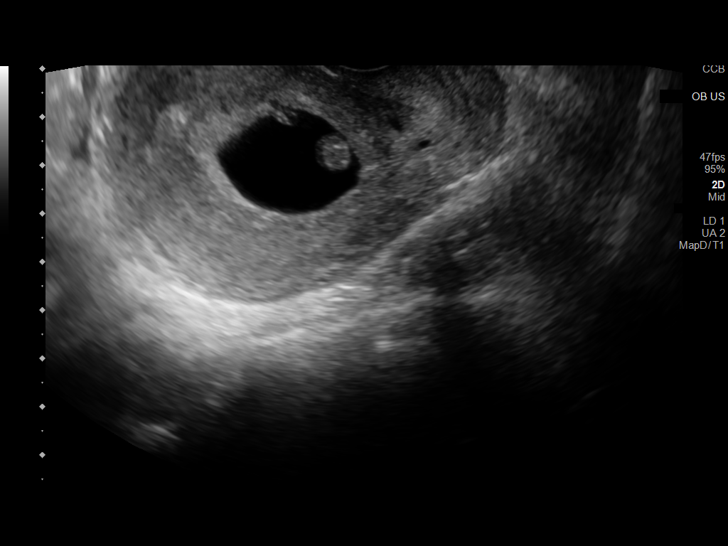

[14 of 28 positions shown; findings below may reference images not displayed]

FINDINGS: Intrauterine gestational sac: Single

Yolk sac:  Not Visualized.

Embryo:  Visualized.

Cardiac Activity: Not Visualized. Cardiac activity and fetal heart
rate of 143 beats per minute was noted on the prior study.

Heart Rate: N/A bpm

MSD:  23 mm   7 w   2 d

CRL:   17 mm   8 w 1 d                  US EDC:

Subchorionic hemorrhage:  None visualized.

Maternal uterus/adnexae: A 2.0 cm x 1.6 cm x 1.7 cm exophytic,
anechoic structure is seen within the right ovary.

The left ovary is visualized and is normal in appearance.

No pelvic free fluid is seen.
IMPRESSION: Single, nonviable intrauterine pregnancy, at approximately 8 weeks
and 1 day gestation by ultrasound evaluation. Findings meet
definitive criteria for failed pregnancy. This follows SRU consensus
guidelines: Diagnostic Criteria for Nonviable Pregnancy Early in the
First Trimester. N Engl J Med 1915;[DATE].

## 2022-10-28 ENCOUNTER — Encounter (HOSPITAL_BASED_OUTPATIENT_CLINIC_OR_DEPARTMENT_OTHER): Payer: Self-pay | Admitting: Emergency Medicine

## 2022-10-28 ENCOUNTER — Emergency Department (HOSPITAL_BASED_OUTPATIENT_CLINIC_OR_DEPARTMENT_OTHER)
Admission: EM | Admit: 2022-10-28 | Discharge: 2022-10-28 | Disposition: A | Discharge status: Home / Self Care | Payer: Self-pay | Attending: Emergency Medicine | Admitting: Emergency Medicine

## 2022-10-28 ENCOUNTER — Emergency Department (HOSPITAL_BASED_OUTPATIENT_CLINIC_OR_DEPARTMENT_OTHER): Payer: Self-pay

## 2022-10-28 ENCOUNTER — Other Ambulatory Visit: Payer: Self-pay

## 2022-10-28 DIAGNOSIS — U071 COVID-19: Secondary | ICD-10-CM | POA: Insufficient documentation

## 2022-10-28 DIAGNOSIS — J45909 Unspecified asthma, uncomplicated: Secondary | ICD-10-CM | POA: Insufficient documentation

## 2022-10-28 LAB — RESP PANEL BY RT-PCR (RSV, FLU A&B, COVID)  RVPGX2
Influenza A by PCR: NEGATIVE
Influenza B by PCR: NEGATIVE
Resp Syncytial Virus by PCR: NEGATIVE
SARS Coronavirus 2 by RT PCR: POSITIVE — AB

## 2022-10-28 MED ORDER — PROMETHAZINE-DM 6.25-15 MG/5ML PO SYRP: 2.5000 mL | ORAL_SOLUTION | Freq: Four times a day (QID) | ORAL | 0 refills | Status: AC | PRN

## 2022-10-28 MED ORDER — PAXLOVID (300/100) 20 X 150 MG & 10 X 100MG PO TBPK
3.0000 | ORAL_TABLET | Freq: Two times a day (BID) | ORAL | 0 refills | Status: AC
Start: 2022-10-28 — End: 2022-11-02

## 2022-10-28 MED ORDER — IPRATROPIUM-ALBUTEROL 0.5-2.5 (3) MG/3ML IN SOLN
3.0000 mL | Freq: Once | RESPIRATORY_TRACT | Status: AC
  Administered 2022-10-28: 3 mL via RESPIRATORY_TRACT
  Filled 2022-10-28: qty 3

## 2022-10-28 NOTE — ED Provider Notes (Signed)
[facility] [place] Provider Note   CSN: [id number] Arrival date & time: 10/28/22  1118     History  Chief Complaint  Patient presents with   Cough   Asthma Exacerbation    Ashley Benton is a 27 y.o. female.  Patient with history of asthma presents today with complaints of cough and congestion. She states that several weeks ago she had a known flu exposure and soon after developed fevers, chills, bodyaches, cough, and congestion. She states that her symptoms did completely resolve, however only a few days later which was about 10 days ago she began coughing again. She originally assumed that her cough was just residual from the flu, however states that her cough seems to be getting worse. She states that originally the cough was dry, however now she is cough up yellow sputum. She is concerned this is an asthma exacerbation. She has been taking her home albuterol and nebulizer with some relief. Denies fevers or chills. No chest pain or shortness of breath.  The history is provided by the patient. No language interpreter was used.  Cough      Home Medications Prior to Admission medications   Medication Sig Start Date End Date Taking? Authorizing Provider  oxyCODONE-acetaminophen (PERCOCET/ROXICET) 5-325 MG tablet Take 1 tablet by mouth every 4 (four) hours as needed for severe pain. 09/23/20   Gabriel Carina, CNM  albuterol (PROVENTIL HFA;VENTOLIN HFA) 108 (90 Base) MCG/ACT inhaler Inhale 2 puffs into the lungs every 4 (four) hours as needed for wheezing or shortness of breath.    [provider]  fosfomycin (MONUROL) 3 g PACK Take 3 g by mouth once.    [provider]  ibuprofen (ADVIL) 600 MG tablet Take 1 tablet (600 mg total) by mouth every 6 (six) hours as needed. 07/27/21   Isla Pence, MD  medroxyPROGESTERone (DEPO-PROVERA) 150 MG/ML injection Inject 150 mg into the muscle every 3 (three) months.    [provider]  oseltamivir (TAMIFLU) 75 MG capsule Take 1 capsule (75 mg total) by mouth every 12 (twelve) hours. 07/27/21   Isla Pence, MD  potassium chloride SA (KLOR-CON M20) 20 MEQ tablet Take 1 tablet (20 mEq total) by mouth 2 (two) times daily for 3 days. 01/30/20 02/02/20  Curatolo, Adam, DO  predniSONE (STERAPRED UNI-PAK 21 TAB) 10 MG (21) TBPK tablet Take by mouth daily. Take 6 tabs by mouth daily  for 2 days, then 5 tabs for 2 days, then 4 tabs for 2 days, then 3 tabs for 2 days, 2 tabs for 2 days, then 1 tab by mouth daily for 2 days 07/27/21   Isla Pence, MD      Allergies    Cephalexin, Molds & smuts, Other, Lactose, and Metronidazole    Review of Systems   Review of Systems  HENT:  Positive for congestion.   Respiratory:  Positive for cough.   All other systems reviewed and are negative.   Physical Exam Updated Vital Signs BP 135/81 (BP Location: Left Arm)   Pulse (!) 112   Temp 98.2 F (36.8 C) (Oral)   Resp 20   Wt 59 kg   LMP 10/14/2022   SpO2 98%   BMI 22.31 kg/m  Physical Exam Vitals and nursing note reviewed.  Constitutional:      General: She is not in acute distress.    Appearance: Normal appearance. She is normal weight. She is not ill-appearing, toxic-appearing or diaphoretic.  HENT:  Head: Normocephalic and atraumatic.  Cardiovascular:     Rate and Rhythm: Normal rate and regular rhythm.     Heart sounds: Normal heart sounds.  Pulmonary:     Effort: Pulmonary effort is normal. No respiratory distress.     Breath sounds: Normal breath sounds. No wheezing.  Abdominal:     General: Abdomen is flat.     Palpations: Abdomen is soft.  Musculoskeletal:        General: Normal range of motion.     Cervical back: Normal range of motion.  Skin:    General: Skin is warm and dry.  Neurological:     General: No focal deficit present.     Mental Status: She is alert.  Psychiatric:        Mood and Affect: Mood normal.        Behavior:  Behavior normal.     ED Results / Procedures / Treatments   Labs (all labs ordered are listed, but only abnormal results are displayed) Labs Reviewed  RESP PANEL BY RT-PCR (RSV, FLU A&B, COVID)  RVPGX2 - Abnormal; Notable for the following components:      Result Value   SARS Coronavirus 2 by RT PCR POSITIVE (*)    All other components within normal limits    EKG None  Radiology DG Chest 2 View  Result Date: 10/28/2022 CLINICAL DATA:  Cough, ongoing for 10 days EXAM: CHEST - 2 VIEW COMPARISON:  09/21/2020 FINDINGS: The heart size and mediastinal contours are within normal limits. Both lungs are clear. The visualized skeletal structures are unremarkable. IMPRESSION: No active cardiopulmonary disease. Electronically Signed   By: Kathreen Devoid M.D.   On: 10/28/2022 12:30    Procedures Procedures    Medications Ordered in ED Medications  ipratropium-albuterol (DUONEB) 0.5-2.5 (3) MG/3ML nebulizer solution 3 mL (3 mLs Nebulization Given 10/28/22 1240)    ED Course/ Medical Decision Making/ A&P                             Medical Decision Making Amount and/or Complexity of Data Reviewed Radiology: ordered.  Risk Prescription drug management.   Patient presents today with complaints of cough and congestion that initially started 10 days ago but worsened in the last 3 days.  She is afebrile, nontoxic-appearing, and in no acute distress with reassuring vital signs.  Found to be COVID-positive.  Symptoms likely consistent with same.  Chest x-ray unremarkable for acute findings.  I have personally reviewed and interpreted this imaging and agree with radiology interpretation.  Her lung sounds are clear to auscultation in all fields without any wheezing.  However, given her reported history of asthma given 1 DuoNeb with subjective improvement in symptoms. She does state that in 2021 she had covid and had to be admitted to the hospital on oxygen, therefore will give paxlovid.  Recent CMP  shows normal creatinine.  Discussed this with the patient who is understanding and in agreement.  She is satting 100% on room air without difficulty here today.  No indication for further evaluation or admission at this time.  Will also give Phenergan cough syrup to help with her symptoms.  Patient is stable for discharge.  States she has sufficient nebulizer treatments and albuterol inhalers at home and does not need refills.  Patient is understanding and amenable with plan, educated on red flag symptoms that would prompt immediate return.  Patient discharged in stable condition.   Final  Clinical Impression(s) / ED Diagnoses Final diagnoses:  COVID-19    Rx / DC Orders ED Discharge Orders          Ordered    nirmatrelvir & ritonavir (PAXLOVID, 300/100,) 20 x 150 MG & 10 x 100MG TBPK  2 times daily        10/28/22 1404    promethazine-dextromethorphan (PROMETHAZINE-DM) 6.25-15 MG/5ML syrup  4 times daily PRN        10/28/22 1404          An After Visit Summary was printed and given to the patient.     Nestor Lewandowsky 10/28/22 1405    Wyvonnia Dusky, MD 10/28/22 1534

## 2022-10-28 NOTE — Discharge Instructions (Addendum)
As we discussed, you positive for COVID today.  As this is a viral illness, no antibiotics are indicated.  I have prescribed you Paxlovid which is the COVID antiviral for you to take as prescribed in its entirety for management of your symptoms.  I have also given you a prescription for Phenergan cough syrup which can help with your cough.  Please take this as prescribed as needed.  Do not drive or operate heavy machinery while taking this medication as it can be sedating. I recommend that you get plenty of rest and focus on symptomatic relief which includes Cepacol throat lozenges for sore throat, Mucinex D (orange box) which you can get from behind the counter at your local pharmacy for congestion, and tylenol/ibuprofen as needed for fevers and bodyaches.  I also recommend:  Increased fluid intake. Sports drinks offer valuable electrolytes, sugars, and fluids.  Breathing heated mist or steam (vaporizer or shower).  Eating chicken soup or other clear broths, and maintaining good nutrition.   Increasing usage of your inhaler if you have asthma.  Return to work when your temperature has returned to normal.  Gargle warm salt water and spit it out for sore throat. Take benadryl or Zyrtec to decrease sinus secretions.  Follow Up: Follow up with your primary care doctor in 5-7 days for recheck of ongoing symptoms.  Return to emergency department for emergent changing or worsening of symptoms.

## 2022-10-28 NOTE — ED Notes (Signed)
   10/28/22 1126  Therapy Vitals  Patient Position (if appropriate) Sitting  Respiratory Assessment  Assessment Type Assess only  Respiratory Pattern Regular  Chest Assessment Chest expansion symmetrical  Cough Non-productive;Strong;Dry  Bilateral Breath Sounds Clear  Oxygen Therapy/Pulse Ox  O2 Device Room Air  SpO2 98 %   HNN albuterol @ 9:30, BBS clear, NP cough, productive at times describes mucous as sticky.

## 2022-10-28 NOTE — ED Triage Notes (Signed)
Pt arrives pov, slow gait, c/o cough x 10 days, endorses hx of asthma. Pt c/o HA today. Increased use of nebs and inhaler at home. Last neb 0930
